# Patient Record
Sex: Female | Born: 1990
Health system: Southern US, Community
[De-identification: ages and names within clinical notes are randomized; demographics above are authoritative.]

## PROBLEM LIST (undated history)

## (undated) DIAGNOSIS — Z3490 Encounter for supervision of normal pregnancy, unspecified, unspecified trimester: Secondary | ICD-10-CM

## (undated) DIAGNOSIS — F259 Schizoaffective disorder, unspecified: Secondary | ICD-10-CM

## (undated) DIAGNOSIS — F53 Postpartum depression: Secondary | ICD-10-CM

## (undated) DIAGNOSIS — F4310 Post-traumatic stress disorder, unspecified: Secondary | ICD-10-CM

## (undated) DIAGNOSIS — F250 Schizoaffective disorder, bipolar type: Secondary | ICD-10-CM

## (undated) DIAGNOSIS — O99345 Other mental disorders complicating the puerperium: Secondary | ICD-10-CM

## (undated) HISTORY — PX: HIP SURGERY: SHX245

---

## 2013-12-20 ENCOUNTER — Emergency Department (HOSPITAL_COMMUNITY)
Admission: EM | Admit: 2013-12-20 | Discharge: 2013-12-21 | Disposition: A | Discharge status: Other Acute Inpatient Hospital | Payer: 59 | Attending: Emergency Medicine | Admitting: Emergency Medicine

## 2013-12-20 ENCOUNTER — Encounter (HOSPITAL_COMMUNITY): Payer: Self-pay

## 2013-12-20 DIAGNOSIS — R000 Tachycardia, unspecified: Secondary | ICD-10-CM | POA: Diagnosis not present

## 2013-12-20 DIAGNOSIS — Z3202 Encounter for pregnancy test, result negative: Secondary | ICD-10-CM | POA: Diagnosis not present

## 2013-12-20 DIAGNOSIS — Z720 Tobacco use: Secondary | ICD-10-CM | POA: Diagnosis not present

## 2013-12-20 DIAGNOSIS — F29 Unspecified psychosis not due to a substance or known physiological condition: Secondary | ICD-10-CM | POA: Diagnosis not present

## 2013-12-20 DIAGNOSIS — F259 Schizoaffective disorder, unspecified: Secondary | ICD-10-CM | POA: Diagnosis not present

## 2013-12-20 DIAGNOSIS — R443 Hallucinations, unspecified: Secondary | ICD-10-CM | POA: Diagnosis present

## 2013-12-20 HISTORY — DX: Schizoaffective disorder, bipolar type: F25.0

## 2013-12-20 HISTORY — DX: Other mental disorders complicating the puerperium: O99.345

## 2013-12-20 HISTORY — DX: Postpartum depression: F53.0

## 2013-12-20 HISTORY — DX: Post-traumatic stress disorder, unspecified: F43.10

## 2013-12-20 HISTORY — DX: Schizoaffective disorder, unspecified: F25.9

## 2013-12-20 LAB — CBC WITH DIFFERENTIAL/PLATELET
Basophils Absolute: 0 10*3/uL (ref 0.0–0.1)
Basophils Relative: 0 % (ref 0–1)
Eosinophils Absolute: 0 10*3/uL (ref 0.0–0.7)
Eosinophils Relative: 0 % (ref 0–5)
HCT: 34.8 % — ABNORMAL LOW (ref 36.0–46.0)
Hemoglobin: 11.6 g/dL — ABNORMAL LOW (ref 12.0–15.0)
Lymphocytes Relative: 17 % (ref 12–46)
Lymphs Abs: 1.7 10*3/uL (ref 0.7–4.0)
MCH: 28.1 pg (ref 26.0–34.0)
MCHC: 33.3 g/dL (ref 30.0–36.0)
MCV: 84.3 fL (ref 78.0–100.0)
Monocytes Absolute: 1 10*3/uL (ref 0.1–1.0)
Monocytes Relative: 10 % (ref 3–12)
Neutro Abs: 7.2 10*3/uL (ref 1.7–7.7)
Neutrophils Relative %: 73 % (ref 43–77)
Platelets: 162 10*3/uL (ref 150–400)
RBC: 4.13 MIL/uL (ref 3.87–5.11)
RDW: 15.2 % (ref 11.5–15.5)
WBC: 9.9 10*3/uL (ref 4.0–10.5)

## 2013-12-20 LAB — COMPREHENSIVE METABOLIC PANEL
ALT: 15 U/L (ref 0–35)
AST: 22 U/L (ref 0–37)
Albumin: 4.4 g/dL (ref 3.5–5.2)
Alkaline Phosphatase: 51 U/L (ref 39–117)
Anion gap: 16 — ABNORMAL HIGH (ref 5–15)
BUN: 11 mg/dL (ref 6–23)
CO2: 24 mEq/L (ref 19–32)
Calcium: 10 mg/dL (ref 8.4–10.5)
Chloride: 104 mEq/L (ref 96–112)
Creatinine, Ser: 0.6 mg/dL (ref 0.50–1.10)
GFR calc Af Amer: >90 mL/min (ref >90)
GFR calc non Af Amer: >90 mL/min (ref >90)
Glucose, Bld: 109 mg/dL — ABNORMAL HIGH (ref 70–99)
Potassium: 3.8 mEq/L (ref 3.7–5.3)
Sodium: 144 mEq/L (ref 137–147)
Total Bilirubin: 0.2 mg/dL — ABNORMAL LOW (ref 0.3–1.2)
Total Protein: 7.9 g/dL (ref 6.0–8.3)

## 2013-12-20 LAB — RAPID URINE DRUG SCREEN, HOSP PERFORMED
Amphetamines: NOT DETECTED
Barbiturates: NOT DETECTED
Benzodiazepines: NOT DETECTED
Cocaine: NOT DETECTED
Opiates: NOT DETECTED
Tetrahydrocannabinol: NOT DETECTED

## 2013-12-20 LAB — POC URINE PREG, ED: Preg Test, Ur: NEGATIVE

## 2013-12-20 LAB — SALICYLATE LEVEL: Salicylate Lvl: <2 mg/dL — ABNORMAL LOW (ref 2.8–20.0)

## 2013-12-20 LAB — ACETAMINOPHEN LEVEL: Acetaminophen (Tylenol), Serum: <15 ug/mL (ref 10–30)

## 2013-12-20 MED ORDER — IBUPROFEN 200 MG PO TABS: 600.0000 mg | ORAL_TABLET | Freq: Three times a day (TID) | ORAL | Status: DC | PRN

## 2013-12-20 MED ORDER — ZIPRASIDONE MESYLATE 20 MG IM SOLR
10.0000 mg | Freq: Once | INTRAMUSCULAR | Status: AC
  Administered 2013-12-20: 10 mg via INTRAMUSCULAR
  Filled 2013-12-20: qty 20

## 2013-12-20 MED ORDER — LORAZEPAM 2 MG/ML IJ SOLN
2.0000 mg | Freq: Once | INTRAMUSCULAR | Status: AC
  Administered 2013-12-20: 2 mg via INTRAMUSCULAR
  Filled 2013-12-20: qty 1

## 2013-12-20 MED ORDER — ONDANSETRON HCL 4 MG PO TABS: 4.0000 mg | ORAL_TABLET | Freq: Three times a day (TID) | ORAL | Status: DC | PRN

## 2013-12-20 MED ORDER — ZOLPIDEM TARTRATE 5 MG PO TABS
5.0000 mg | ORAL_TABLET | Freq: Every evening | ORAL | Status: DC | PRN
  Filled 2013-12-20: qty 1

## 2013-12-20 MED ORDER — ACETAMINOPHEN 325 MG PO TABS: 650.0000 mg | ORAL_TABLET | ORAL | Status: DC | PRN

## 2013-12-20 MED ORDER — NICOTINE 21 MG/24HR TD PT24
21.0000 mg | MEDICATED_PATCH | Freq: Every day | TRANSDERMAL | Status: DC
  Administered 2013-12-20 – 2013-12-21 (×2): 21 mg via TRANSDERMAL
  Filled 2013-12-20 (×2): qty 1

## 2013-12-20 MED ORDER — LORAZEPAM 1 MG PO TABS
1.0000 mg | ORAL_TABLET | Freq: Three times a day (TID) | ORAL | Status: DC | PRN
  Administered 2013-12-20 – 2013-12-21 (×2): 1 mg via ORAL
  Filled 2013-12-20 (×3): qty 1

## 2013-12-20 MED ORDER — ALUM & MAG HYDROXIDE-SIMETH 200-200-20 MG/5ML PO SUSP: 30.0000 mL | ORAL | Status: DC | PRN
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
## 2013-12-20 NOTE — ED Notes (Signed)
Per sister, pt has history of schizoeffective disorder.  Pt started with loss of biological parents death in 2010.  Pt was doing well on medications, yet recently had child and went into postpartum.  Pt has relapsed.  [facility]  Released 2 days ago.  Word salad and responding to internal stimuli.  Son is with her sister.  No evidence of violence.  Unable to get questions answered. With any question to patient, she becomes jumbled in speech.

## 2013-12-20 NOTE — ED Notes (Signed)
Patient intrusive and repeating the phrase "I want to get dressed, I need to leave now". Pt following RN into nursing station and attempting to force her way into doorway. Pt unable to be redirected and security notified for medication administration.  Pt yelling loudly on the unit, tearful "Excuse me, excuse me, excuse me!!!!!" Pt scaring other patients on the unit. RN attempted multiple times to calm patient down unsuccessfully.

## 2013-12-20 NOTE — ED Provider Notes (Signed)
CSN: [id number]     Arrival date & time 12/20/13  1632 History   First MD Initiated Contact with Patient 12/20/13 1721     Chief Complaint  Patient presents with  . Hallucinations     (Consider location/radiation/quality/duration/timing/severity/associated sxs/prior Treatment) HPI Comments: Patient is a 23 year old female with history of schizoaffective schizophrenia, PTSD, postpartum depression who presents the emergency department today for evaluation of abnormal behavior. [facility]. Her sister reports that she has acutely worsened since 10 PM last night. She has tangential thoughts. She states "I can't wear the weather period". Her sister has custody of her 843 month old child. Her sister reports she has not been compliant with medications.   The history is provided by the patient and a relative. No language interpreter was used.    Past Medical History  Diagnosis Date  . Schizo affective schizophrenia   . PTSD (post-traumatic stress disorder)   . Postpartum depression    Past Surgical History  Procedure Laterality Date  . Cesarean section    . Hip surgery     History reviewed. No pertinent family history. History  Substance Use Topics  . Smoking status: Current Every Day Smoker  . Smokeless tobacco: Not on file  . Alcohol Use: No   OB History    No data available     Review of Systems  Unable to perform ROS: Psychiatric disorder      Allergies  Trazodone and nefazodone  Home Medications   Prior to Admission medications   Not on File   BP 143/85 mmHg  Pulse 105  Temp(Src) 98 F (36.7 C) (Oral)  Resp 18  SpO2 100%  LMP 12/06/2013 Physical Exam  Constitutional: She appears well-developed and well-nourished. No distress.  HENT:  Head: Normocephalic and atraumatic.  Right Ear: External ear normal.  Left Ear: External ear normal.  Nose: Nose normal.  Mouth/Throat: Oropharynx is clear and moist.  Eyes:  Conjunctivae are normal.  Neck: Normal range of motion.  Cardiovascular: Regular rhythm and normal heart sounds.  Tachycardia present.   Pulmonary/Chest: Effort normal and breath sounds normal. No stridor. No respiratory distress. She has no wheezes. She has no rales.  Abdominal: Soft. She exhibits no distension.  Musculoskeletal: Normal range of motion.  Neurological: She is alert. She has normal strength.  Oriented to person  Skin: Skin is warm and dry. She is not diaphoretic. No erythema.  Psychiatric: Her speech is tangential. Thought content is delusional. Cognition and memory are impaired. She expresses no homicidal and no suicidal ideation.  Nursing note and vitals reviewed.   ED Course  Procedures (including critical care time) Labs Review Labs Reviewed  CBC WITH DIFFERENTIAL - Abnormal; Notable for the following:    Hemoglobin 11.6 (*)    HCT 34.8 (*)    All other components within normal limits  COMPREHENSIVE METABOLIC PANEL - Abnormal; Notable for the following:    Glucose, Bld 109 (*)    Total Bilirubin 0.2 (*)    Anion gap 16 (*)    All other components within normal limits  SALICYLATE LEVEL - Abnormal; Notable for the following:    Salicylate Lvl <2.0 (*)    All other components within normal limits  ACETAMINOPHEN LEVEL  URINE RAPID DRUG SCREEN (HOSP PERFORMED)  POC URINE PREG, ED    Imaging Review No results found.   EKG Interpretation None      MDM   Final diagnoses:  Schizoaffective disorder, unspecified type  Psychosis, unspecified psychosis type    Patient presents to ED after being brought in at the encouragement of her sister. Tangential thoughts and word salad. She does not have capacity to make her own decisions and needs IVC. IVC paperwork completed. Patient accepted to inpatient psychiatry. Vital signs stable. Patient is medically cleared.     Mora BellmanHannah S Joshu Furukawa, PA-C 12/20/13 2348  Raeford RazorStephen Kohut, MD 12/25/13 2114

## 2013-12-20 NOTE — BH Assessment (Signed)
Received call from [facility] [employer] saying Pt has been declined.  [name], LPC, NCC Triage Specialist [phone]  

## 2013-12-20 NOTE — BHH Counselor (Signed)
Pt's sister Zacarias PontesJasmine Broce, [phone]. Katrinka BlazingSmith is guardian of pt's infant son Seleta Rhymesrinton and Katrinka BlazingSmith is caring for the infant.  Pt signed consent to release info.  Evette Cristalaroline Paige Kamirah Shugrue, [place]WA Assessment Counselor

## 2013-12-20 NOTE — ED Notes (Signed)
Patient to room 43. She is confused and disoriented.  Asking to leave and for a smoking patch.  Declines ativan po. Constant redirection given.

## 2013-12-20 NOTE — BH Assessment (Signed)
Berneice Heinrichina Tate, [facility], confirms adult unit is at capacity. Kathryne SharperSyed Arfeen, MD recommends inpatient psychiatric treatment. Contacted the following facilities for placement:  BED AVAILABLE, FAXED CLINICAL INFORMATION: [facility]  AT CAPACITY[facility]  NO RESPONSE: [facility]   [address]Naimah Yingst Ellis Patsy BaltimoreWarrick Jr, [place]LPC, [place]CC Triage Specialist [phone]
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
## 2013-12-21 ENCOUNTER — Inpatient Hospital Stay (HOSPITAL_COMMUNITY)
Admission: AD | Admit: 2013-12-21 | Discharge: 2013-12-27 | DRG: 885 | Disposition: A | Discharge status: Home / Self Care | Payer: 59 | Attending: Psychiatry | Admitting: Psychiatry

## 2013-12-21 ENCOUNTER — Encounter (HOSPITAL_COMMUNITY): Payer: Self-pay | Admitting: *Deleted

## 2013-12-21 DIAGNOSIS — F250 Schizoaffective disorder, bipolar type: Secondary | ICD-10-CM | POA: Insufficient documentation

## 2013-12-21 DIAGNOSIS — Z9119 Patient's noncompliance with other medical treatment and regimen: Secondary | ICD-10-CM | POA: Diagnosis present

## 2013-12-21 DIAGNOSIS — F4310 Post-traumatic stress disorder, unspecified: Secondary | ICD-10-CM

## 2013-12-21 DIAGNOSIS — F310 Bipolar disorder, current episode hypomanic: Secondary | ICD-10-CM | POA: Diagnosis present

## 2013-12-21 DIAGNOSIS — F53 Postpartum depression: Secondary | ICD-10-CM | POA: Insufficient documentation

## 2013-12-21 DIAGNOSIS — F17210 Nicotine dependence, cigarettes, uncomplicated: Secondary | ICD-10-CM | POA: Diagnosis present

## 2013-12-21 DIAGNOSIS — F3130 Bipolar disorder, current episode depressed, mild or moderate severity, unspecified: Secondary | ICD-10-CM

## 2013-12-21 DIAGNOSIS — F259 Schizoaffective disorder, unspecified: Secondary | ICD-10-CM | POA: Insufficient documentation

## 2013-12-21 DIAGNOSIS — O99345 Other mental disorders complicating the puerperium: Secondary | ICD-10-CM

## 2013-12-21 DIAGNOSIS — F251 Schizoaffective disorder, depressive type: Secondary | ICD-10-CM | POA: Diagnosis present

## 2013-12-21 MED ORDER — BENZTROPINE MESYLATE 1 MG PO TABS
1.0000 mg | ORAL_TABLET | Freq: Two times a day (BID) | ORAL | Status: DC
  Administered 2013-12-22 – 2013-12-25 (×7): 1 mg via ORAL
  Filled 2013-12-21 (×12): qty 1

## 2013-12-21 MED ORDER — DIVALPROEX SODIUM 500 MG PO DR TAB
500.0000 mg | DELAYED_RELEASE_TABLET | Freq: Two times a day (BID) | ORAL | Status: DC
  Administered 2013-12-22 – 2013-12-26 (×9): 500 mg via ORAL
  Filled 2013-12-21 (×12): qty 1

## 2013-12-21 MED ORDER — ACETAMINOPHEN 325 MG PO TABS
650.0000 mg | ORAL_TABLET | Freq: Four times a day (QID) | ORAL | Status: DC | PRN
  Administered 2013-12-23 – 2013-12-24 (×2): 650 mg via ORAL
  Filled 2013-12-21 (×2): qty 2

## 2013-12-21 MED ORDER — ZOLPIDEM TARTRATE 5 MG PO TABS: 5.0000 mg | ORAL_TABLET | Freq: Every evening | ORAL | Status: DC | PRN

## 2013-12-21 MED ORDER — ONDANSETRON HCL 4 MG PO TABS: 4.0000 mg | ORAL_TABLET | Freq: Three times a day (TID) | ORAL | Status: DC | PRN

## 2013-12-21 MED ORDER — DIVALPROEX SODIUM 500 MG PO DR TAB
500.0000 mg | DELAYED_RELEASE_TABLET | Freq: Two times a day (BID) | ORAL | Status: DC
  Administered 2013-12-21: 500 mg via ORAL
  Filled 2013-12-21: qty 1

## 2013-12-21 MED ORDER — LORAZEPAM 1 MG PO TABS
1.0000 mg | ORAL_TABLET | Freq: Three times a day (TID) | ORAL | Status: DC | PRN
  Administered 2013-12-22 – 2013-12-23 (×3): 1 mg via ORAL
  Filled 2013-12-21 (×3): qty 1

## 2013-12-21 MED ORDER — NICOTINE 21 MG/24HR TD PT24
21.0000 mg | MEDICATED_PATCH | Freq: Every day | TRANSDERMAL | Status: DC
  Administered 2013-12-22 – 2013-12-27 (×6): 21 mg via TRANSDERMAL
  Filled 2013-12-21 (×12): qty 1

## 2013-12-21 MED ORDER — BENZTROPINE MESYLATE 1 MG PO TABS
1.0000 mg | ORAL_TABLET | Freq: Two times a day (BID) | ORAL | Status: DC
  Administered 2013-12-21: 1 mg via ORAL
  Filled 2013-12-21: qty 1

## 2013-12-21 MED ORDER — MAGNESIUM HYDROXIDE 400 MG/5ML PO SUSP: 30.0000 mL | Freq: Every day | ORAL | Status: DC | PRN

## 2013-12-21 MED ORDER — RISPERIDONE 2 MG PO TABS
2.0000 mg | ORAL_TABLET | Freq: Two times a day (BID) | ORAL | Status: DC
  Administered 2013-12-22 – 2013-12-24 (×5): 2 mg via ORAL
  Filled 2013-12-21 (×8): qty 1

## 2013-12-21 MED ORDER — ALUM & MAG HYDROXIDE-SIMETH 200-200-20 MG/5ML PO SUSP
30.0000 mL | ORAL | Status: DC | PRN
Start: 2013-12-21 — End: 2013-12-21

## 2013-12-21 MED ORDER — RISPERIDONE 2 MG PO TABS
2.0000 mg | ORAL_TABLET | Freq: Two times a day (BID) | ORAL | Status: DC
  Administered 2013-12-21: 2 mg via ORAL
  Filled 2013-12-21: qty 1

## 2013-12-21 MED ORDER — ALUM & MAG HYDROXIDE-SIMETH 200-200-20 MG/5ML PO SUSP: 30.0000 mL | ORAL | Status: DC | PRN

## 2013-12-21 MED ORDER — IBUPROFEN 600 MG PO TABS: 600.0000 mg | ORAL_TABLET | Freq: Three times a day (TID) | ORAL | Status: DC | PRN

## 2013-12-21 NOTE — ED Notes (Signed)
rn called 911 at 1748 and spoke to sara for transport of this pt. [facility] was called [facility]. sgt moose called at 1753 and stated they will p/u pt soon. Pt took all scheduled medications with a ativan prn for her anxiety at 1707;

## 2013-12-21 NOTE — ED Notes (Signed)
Nursing shift note: pt has come out of her room 2 to 3 times today.she has been cooperative, yet anxious and ask the same question several times. She seems to be responding to auditory hallucinations. He sister jasmine is part of her support network. She is an involuntary admission. She has no complained or voiced any needs.
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
## 2013-12-21 NOTE — Tx Team (Signed)
Initial Interdisciplinary Treatment Plan   PATIENT STRESSORS: Financial difficulties Medication change or noncompliance   PATIENT STRENGTHS: Communication skills   PROBLEM LIST: Problem List/Patient Goals Date to be addressed Date deferred Reason deferred Estimated date of resolution  psychosis 12/21/13   dc                                                   DISCHARGE CRITERIA:  Ability to meet basic life and health needs Improved stabilization in mood, thinking, and/or behavior Motivation to continue treatment in a less acute level of care Reduction of life-threatening or endangering symptoms to within safe limits Safe-care adequate arrangements made  PRELIMINARY DISCHARGE PLAN: Outpatient therapy Return to previous living arrangement  PATIENT/FAMIILY INVOLVEMENT: This treatment plan has been presented to and reviewed with the patient, Brandy Sweeney, and/or family member, .  The patient and family have been given the opportunity to ask questions and make suggestions.  Malva LimesStrader, Koya Hunger 12/21/2013, 7:18 PM

## 2013-12-21 NOTE — Progress Notes (Signed)
Nursing note; [facility] and [facility].

## 2013-12-21 NOTE — Progress Notes (Signed)
Patient ID: Brandy Sweeney, female   DOB: 06/12/1990, 23 y.o.   MRN: [id number] [facility]. Pt is poor historian. Pt presents with disorganized thoughts, affect blunted. She states '' i came to get a shot to get my meds adjusted because I've been irritable and hearing things. '' She denies any auditory hallucinations at present, denies any SI/HI. Pt clearly preoccupied and with noted psychomotor retardation. Pt having difficulty following simply commands at times. Skin was searched no issues noted and no contraband found. She was oriented to the unit. Low fall risk. Meal provided. VS WNL.

## 2013-12-22 DIAGNOSIS — O99345 Other mental disorders complicating the puerperium: Secondary | ICD-10-CM

## 2013-12-22 DIAGNOSIS — F259 Schizoaffective disorder, unspecified: Secondary | ICD-10-CM

## 2013-12-22 DIAGNOSIS — F250 Schizoaffective disorder, bipolar type: Secondary | ICD-10-CM | POA: Insufficient documentation

## 2013-12-22 DIAGNOSIS — F53 Postpartum depression: Secondary | ICD-10-CM | POA: Insufficient documentation

## 2013-12-22 DIAGNOSIS — F3160 Bipolar disorder, current episode mixed, unspecified: Secondary | ICD-10-CM

## 2013-12-22 NOTE — [facility] Group Notes (Signed)
[facility] LCSW Group Therapy  12/22/2013 11:00 AM   Type of Therapy:  Group Therapy  Participation Level:  Did Not Attend  Easter Kennebrew Horton, LCSW 12/22/2013 12:05 PM   

## 2013-12-22 NOTE — Progress Notes (Signed)
D: Pt has depressed affect and mood.  Pt reports her goal today was "going home and being stable on my medicine."  Pt reports she had a good visit with her sister, brother, and son.  Pt denies SI/HI, denies hallucinations.  Pt interacts appropriately with peers and staff.  Pt did not attend evening group.   A: Met with pt 1:1 and provided support and encouragement.  Safety maintained. R: Pt reported she will notify writer of needs and concerns.  Pt verbally contracts for safety and is in no apparent distress.  Will continue to monitor and assess for safety.

## 2013-12-22 NOTE — [facility] Suicide Risk Assessment (Signed)
   Nursing information obtained from:  Patient, Review of record Demographic factors:  Adolescent or young adult, Low socioeconomic status Current Mental Status:  NA Loss Factors:  Decline in physical health Historical Factors:  NA Risk Reduction Factors:  Responsible for children under 23 years of age Total Time spent with patient: 1 hour  CLINICAL FACTORS:   Postpartum Depression More than one psychiatric diagnosis Currently Psychotic Previous Psychiatric Diagnoses and Treatments  Psychiatric Specialty Exam: Physical Exam  ROS  Blood pressure 96/67, pulse 96, temperature 97.7 F (36.5 C), temperature source Oral, resp. rate 20, height 5\' 5"  (1.651 m), weight 107.502 kg (237 lb), last menstrual period 12/06/2013, SpO2 100 %.Body mass index is 39.44 kg/(m^2).  General Appearance: Guarded  Eye Contact::  Poor  Speech:  Pressured  Volume:  Normal  Mood:  Depressed and Irritable  Affect:  Constricted, Depressed and Labile  Thought Process:  Disorganized, Irrelevant and Loose  Orientation:  Full (Time, Place, and Person)  Thought Content:  Delusions, Hallucinations: Auditory Visual and Paranoid Ideation  Suicidal Thoughts:  No  Homicidal Thoughts:  No  Memory:  Immediate;   Poor Recent;   Fair Remote;   Poor  Judgement:  Impaired  Insight:  Lacking  Psychomotor Activity:  Decreased  Concentration:  Fair  Recall:  Poor  Fund of Knowledge:Fair  Language: Fair  Akathisia:  No  Handed:  Right  AIMS (if indicated):     Assets:  Housing  Sleep:  Number of Hours: 6.75   Musculoskeletal: Strength & Muscle Tone: within normal limits Gait & Station: normal Patient leans: N/A  COGNITIVE FEATURES THAT CONTRIBUTE TO RISK:  Closed-mindedness Loss of executive function Polarized thinking Thought constriction (tunnel vision)    SUICIDE RISK:   Mild:  Suicidal ideation of limited frequency, intensity, duration, and specificity.  There are no identifiable plans, no associated  intent, mild dysphoria and related symptoms, good self-control (both objective and subjective assessment), few other risk factors, and identifiable protective factors, including available and accessible social support.  PLAN OF CARE:  I certify that inpatient services furnished can reasonably be expected to improve the patient's condition.  Abijah Roussel T. 12/22/2013, 11:08 AM

## 2013-12-22 NOTE — Plan of Care (Signed)
Problem: Ineffective individual coping Goal: STG: Patient will remain free from self harm Outcome: Progressing Patient has remained free from harm this shift.

## 2013-12-22 NOTE — [facility] Group Notes (Signed)
[facility] Group Notes:  (Nursing/MHT/Case Management/Adjunct)  Date:  12/22/2013  Time:  9:13 AM  Type of Therapy: Psychoeducational Skills  Participation Level: Active  Participation Quality: Appropriate  Affect: Appropriate  Cognitive: Appropriate  Insight: Appropriate  Engagement in Group: Engaged  Modes of Intervention: Problem-solving  Summary of Progress/Problems: Pt attended patient self inventory group.  Brandy BalintForrest, Brandy Sweeney Brandy Sweeney 12/22/2013, 9:13 AM

## 2013-12-22 NOTE — Progress Notes (Signed)
Adult Psychoeducational Group Note  Date:  12/22/2013 Time:  8:47 PM  Group Topic/Focus:  Wrap-Up Group:   The focus of this group is to help patients review their daily goal of treatment and discuss progress on daily workbooks.  Participation Level:  Did Not Attend   Brandy Sweeney, Brandy Sweeney 12/22/2013, 8:47 PM
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
## 2013-12-22 NOTE — BHH Group Notes (Signed)
BHH Group Notes:  (Nursing/MHT/Case Management/Adjunct)  Date:  12/22/2013  Time:  10:09 AM  Type of Therapy: Psychoeducational Skills  Participation Level: Active  Participation Quality: Appropriate  Affect: Appropriate  Cognitive: Appropriate  Insight: Appropriate  Engagement in Group: Engaged  Modes of Intervention: Problem-solving  Summary of Progress/Problems: Pt attended healthy coping skills group.  Brandy Sweeney 12/22/2013, 10:09 AM 

## 2013-12-22 NOTE — Progress Notes (Signed)
Report received from L. Strader RN, patient was admitted moments before shift change. Writer entered patients room and observed her lying in bed asleep but was aroused when Clinical research associatewriter called her name. She had no request for medication and was offered a snack but declined. She denies si/hi/a/v hallucination, writer was unable to assess her d/t her drowsiness. Safety maintained on unit with 15 min checks.

## 2013-12-22 NOTE — Progress Notes (Signed)
Patient ID: Brandy Sweeney, female   DOB: 05/20/1990, 23 y.o.   MRN: [id number] D. Pt presents with depressed mood , affect congruent. Brandy Sweeney has been pleasant and cooperative throughout shift. She reports her auditory hallucinations have stopped and she states '' I just want to get on the injection so i can get back home to my son. Maybe I can even get my own place. '' She denies feeling depressed, denies any SI/HI. Brandy Sweeney shows great improvement this morning , interacting on the unit, smiling with staff and peers. She completed self inventory and rates no concerns stating ''when can I go home. '' She denies depression rating at 1/10 on depression scale, 1 being least 10 being worst. A. Medications given as ordered. Support and encouragement provided. Discussed above with Dr. Lolly MustacheArfeen and May NP. R. Patient is safe. In no acute distress at this time. Will continue to monitor q 15 minutes for safety.

## 2013-12-22 NOTE — BHH Counselor (Signed)
Adult Comprehensive Assessment  Patient ID: Brandy Sweeney, female   DOB: 06/03/1990, 23 y.o.   MRN: [id number]  Information Source: Information source: Patient  Current Stressors:  Educational / Learning stressors: Pt denies Employment / Job issues: Pt denies Family Relationships: Pt denies Surveyor, quantityinancial / Lack of resources (include bankruptcy): "I want to work and go to school but I just had a baby 3 months ago" Currently unemployed Housing / Lack of housing: Currently living with mother and trying to have her own place but sister thinks she needs to be in a group home due to not being able to fully provide for herself and her son. Pt reports she does not want to go to a group home. Physical health (include injuries & life threatening diseases): Pt reports having had hip surgery 4 years ago.  Social relationships: Pt denies Substance abuse: Pt denies Bereavement / Loss: Biological parents passed away about 5 years go.   Living/Environment/Situation:  Living Arrangements: Parent, Children (Living with foster mother and 563 month old child) Living conditions (as described by patient or guardian): "Great environment" How long has patient lived in current situation?: 6th grade What is atmosphere in current home: Comfortable  Family History:  Marital status: Single Does patient have children?: Yes How many children?: 1 How is patient's relationship with their children?: "Good, he is little but I would do anything for him"  Childhood History:  By whom was/is the patient raised?: Mother, Sibling (Raised by adoptive mom, foster mom and older sister; however, was in and out of group homes since age 23.) Description of patient's relationship with caregiver when they were a child: "It was just good" Patient's description of current relationship with people who raised him/her: Pt reports "good" relationship with all three. "My sister does everything she can to provide for me even though I am 23"  Does  patient have siblings?: Yes Number of Siblings: 7 Description of patient's current relationship with siblings: "It's good, I am closer to my older brother"  Did patient suffer any verbal/emotional/physical/sexual abuse as a child?: No Did patient suffer from severe childhood neglect?: No Has patient ever been sexually abused/assaulted/raped as an adolescent or adult?: No Was the patient ever a victim of a crime or a disaster?: No Witnessed domestic violence?: No Has patient been effected by domestic violence as an adult?: No  Education:  Highest grade of school patient has completed: Some college Currently a Consulting civil engineerstudent?: No Learning disability?: No  Employment/Work Situation:   Employment situation: Unemployed Patient's job has been impacted by current illness: No What is the longest time patient has a held a job?: 6-9 months 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 Has patient ever been in the Eli Lilly and Companymilitary?: No Has patient ever served in Buyer, retailcombat?: No  Financial Resources:   Surveyor, quantityinancial resources: Actoreceives [employer] (TANF), [employer]icaid, Food stamps Does patient have a Lawyerrepresentative payee or guardian?: Yes Name of representative payee or guardian: Clydia LlanoGale Ray   Alcohol/Substance Abuse:   What has been your use of drugs/alcohol within the last 12 months?: "I don't drink and I only smoke cigarettes" If attempted suicide, did drugs/alcohol play a role in this?: No Alcohol/Substance Abuse Treatment Hx: Denies past history Has alcohol/substance abuse ever caused legal problems?: No  Social Support System:   Patient's Community Support System: Fair Museum/gallery exhibitions officerDescribe Community Support System: "All my siblings really help me and my foster mom" Type of faith/religion: Ephriam KnucklesChristian How does patient's faith help to cope with current  illness?: "Prayer helps"  Leisure/Recreation:   Leisure and Hobbies: Listen to music  Strengths/Needs:   What things does the patient do well?:  Reading, writing, dancing In what areas does patient struggle / problems for patient: Stronger social skills  Discharge Plan:   Does patient have access to transportation?: Yes Will patient be returning to same living situation after discharge?: Yes Currently receiving community mental health services: Yes (From Whom) Diplomatic Services operational officer([facility] [place] [facility] If no, would patient like referral for services when discharged?: No Does patient have financial barriers related to discharge medications?: No  Summary/Recommendations:   Patient is a 23 year old, African American, single, unemployed, female admitted with diagnosis of Schizoaffective disorder. Pt reports she is currently living with foster mom and is trying to get her place; however, sister wants her to go to group home. Pt was limited in her sharing. Patient would benefit from crisis stabilization, medication evaluation, therapy groups for processing thoughts/feelings/experiences, psycho ed groups for increasing coping skills, and aftercare planning. Discharge Process and Patient Expectations information sheet signed by patient, witnessed by writer and inserted in patient's shadow chart.   Calton DachWendy F. Faheem Ziemann, MSW, [facility] AugustaCSWA 12/22/2013 4:23 PM

## 2013-12-23 DIAGNOSIS — F251 Schizoaffective disorder, depressive type: Secondary | ICD-10-CM

## 2013-12-23 NOTE — Progress Notes (Signed)
Pt presents with flat affect and depressed mood. Pt forwarded little information during shift assessment. Pt appears to be minimizing her symptoms. Pt guarded and withdrawn this morning. Pt denying SI/HI/AVH/Dep. Pt shows no insight. Pt requesting to be discharge today. Pt compliant with taking meds this morning.   Medications administered as ordered per MD. Verbal support given. Pt encouraged to attend groups. 15 minute checks performed for safety.  Pt safety maintained at this time.

## 2013-12-23 NOTE — BHH Group Notes (Signed)
[facility] Dis[facility] Note   12/23/2013 10:13 AM  Participation Quality:  Appropriate   Mood/Affect:  Appropriate  Depression Rating:  0  Anxiety Rating:  2  Thoughts of Suicide:  No Will you contract for safety?   NA  Current AVH:  No  Plan for Discharge/Comments:  "I was just at the hospital for two weeks. I only came here because I couldn't get my injection due to the holiday." Pt reports [facility] [place] [facility]. Pt reports that she is in the process of moving into a new apt but will stay with her brother until she is able to move. [facility] and plans to continue services with them. Pt is anxious to d/c and denies SI/HI/AVH.   Transportation Means: sister   Supports: siblings   Counselling psychologistmart, OncologistHeather LCSWA
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
## 2013-12-23 NOTE — Plan of Care (Signed)
Problem: Ineffective individual coping Goal: STG: Patient will remain free from self harm Outcome: Progressing Pt has not harmed herself this shift.       

## 2013-12-23 NOTE — BHH Group Notes (Signed)
B[facility] Group Notes:  (Counselor/Nursing/MHT/Case Management/Adjunct)  12/23/2013 1:15PM  Type of Therapy:  Group Therapy  Participation Level:  Active  Participation Quality:  Appropriate  Affect:  Flat  Cognitive:  Oriented  Insight:  Improving  Engagement in Group:  Limited  Engagement in Therapy:  Limited  Modes of Intervention:  Discussion, Exploration and Socialization  Summary of Progress/Problems: The topic for group was balance in life.  Pt participated in the discussion about when their life was in balance and out of balance and how this feels.  Pt discussed ways to get back in balance and short term goals they can work on to get where they want to be. "When I get angry, I tend to isolate myself because I do not want to have problems with others.  My sister and I get into arguments because she thinks she knows what's best for me."  Sat quietly throughout.  At the end, identified reading and listening to music as things that take her to a happy place.   Daryel Geraldorth, Shakema Surita B 12/23/2013 1:34 PM

## 2013-12-23 NOTE — Progress Notes (Signed)
D: Pt denies SI/HI/AVH. Pt is pleasant and cooperative. Pt decided to go to group after convincing from Clinical research associatewriter.   A: Pt was offered support and encouragement. Pt was given scheduled medications. Pt was encourage to attend groups. Q 15 minute checks were done for safety.   R:Pt attends groups and interacts well with peers and staff. Pt is taking medication. Pt has no complaints at this time.Pt receptive to treatment and safety maintained on unit.

## 2013-12-23 NOTE — Progress Notes (Signed)
[facility] Progress Note  12/23/2013 2:32 PM Brandy Sweeney  MRN:  [id number] Subjective:  Patient states,'I am feeling better,I want to go to my mother's place." Objective: Patient seen and chart reviewed. Patient today appears to be less anxious ,reports she has improved sleep on her current medications. Patient reports that she has a newborn and that her sx worsened secondary to all the stressors of having the newborn and having to take care of the baby ,as well as not getting enough sleep and also having a lot of guests for thanksgiving. Patient reports that her sister send her to her brother's place ,however she still could not sleep and started doing a lot of activities.  Patient has a hx of schizoaffective disorder, recently was [facility] ([facility] -25). Patient to get her Risperdal consta once every month .Per review of EHR -nxt due on 12/26/2013.  Patient today appears to be improving. Denies SI/HI/AH/VH.Patient continues to have pressured speech as well as appears to be anxious ,however there is an improvement since admission. Patient wants to stay on Risperdal and Risperdal Consta.   Per collateral obtained from sister Leavy CellaJasmine - patient's baby is with her now and she is trying to get custody. However the baby is a trigger for the patient and the patient will not do well with the baby in the same house. Hence pt will be better off if referred for a long term facility. Patient could go to god mother's place but this needs to be discussed with God mother - whose number is Clydia LlanoGale Ray [phone]- [phone].Case manager - Ms.Kirk - [phone]. Pt also has [facility].   Diagnosis:   DSM5: Primary Psychiatric Diagnosis: Schizoaffective disorder,depressed type,multiple episodes ,currently in acute episode     Non Psychiatric Diagnosis: See PMH     Total Time spent with patient: 45 minutes   ADL's:  Intact  Sleep: Fair  Appetite:   Fair    Psychiatric Specialty Exam: Physical Exam  ROS  Blood pressure 126/84, pulse 68, temperature 98.7 F (37.1 C), temperature source Oral, resp. rate 18, height 5\' 5"  (1.651 m), weight 107.502 kg (237 lb), last menstrual period 12/06/2013, SpO2 100 %.Body mass index is 39.44 kg/(m^2).  General Appearance: Casual  Eye Contact::  Fair  Speech:  Pressured  Volume:  Normal  Mood:  Anxious and Depressed  Affect:  Flat  Thought Process:  Linear  Orientation:  Full (Time, Place, and Person)  Thought Content:  WDL  Suicidal Thoughts:  No  Homicidal Thoughts:  No  Memory:  Immediate;   Fair Recent;   Fair Remote;   Fair  Judgement:  Fair  Insight:  Shallow  Psychomotor Activity:  Increased  Concentration:  Fair  Recall:  Poor  Fund of Knowledge:Fair  Language: Fair  Akathisia:  No  Handed:  Right  AIMS (if indicated):     Assets:  Desire for Improvement Housing  Sleep:  Number of Hours: 5.5   Musculoskeletal: Strength & Muscle Tone: within normal limits Gait & Station: normal Patient leans: N/A  Current Medications: Current Facility-Administered Medications  Medication Dose Route Frequency Provider Last Rate Last Dose  . acetaminophen (TYLENOL) tablet 650 mg  650 mg Oral Q6H PRN Nanine MeansJamison Lord, NP      . alum & mag hydroxide-simeth (MAALOX/MYLANTA) 200-200-20 MG/5ML suspension 30 mL  30 mL Oral Q4H PRN Nanine MeansJamison Lord, NP      . benztropine (COGENTIN) tablet 1 mg  1 mg  Oral BID Nanine MeansJamison Lord, NP   1 mg at 12/23/13 0749  . divalproex (DEPAKOTE) DR tablet 500 mg  500 mg Oral BID PC Nanine MeansJamison Lord, NP   500 mg at 12/23/13 0750  . ibuprofen (ADVIL,MOTRIN) tablet 600 mg  600 mg Oral Q8H PRN Nanine MeansJamison Lord, NP      . LORazepam (ATIVAN) tablet 1 mg  1 mg Oral Q8H PRN Nanine MeansJamison Lord, NP   1 mg at 12/23/13 0630  . magnesium hydroxide (MILK OF MAGNESIA) suspension 30 mL  30 mL Oral Daily PRN Nanine MeansJamison Lord, NP      . nicotine (NICODERM CQ - dosed in mg/24 hours) patch 21 mg  21 mg Transdermal  Daily Nanine MeansJamison Lord, NP   21 mg at 12/23/13 0749  . ondansetron (ZOFRAN) tablet 4 mg  4 mg Oral Q8H PRN Nanine MeansJamison Lord, NP      . risperiDONE (RISPERDAL) tablet 2 mg  2 mg Oral BID Nanine MeansJamison Lord, NP   2 mg at 12/23/13 0749  . zolpidem (AMBIEN) tablet 5 mg  5 mg Oral QHS PRN Nanine MeansJamison Lord, NP        Lab Results: No results found for this or any previous visit (from the past 48 hour(s)).  Physical Findings: AIMS: Facial and Oral Movements Muscles of Facial Expression: None, normal Lips and Perioral Area: None, normal Jaw: None, normal Tongue: None, normal,Extremity Movements Upper (arms, wrists, hands, fingers): None, normal Lower (legs, knees, ankles, toes): None, normal, Trunk Movements Neck, shoulders, hips: None, normal, Overall Severity Severity of abnormal movements (highest score from questions above): None, normal Incapacitation due to abnormal movements: None, normal Patient's awareness of abnormal movements (rate only patient's report): No Awareness, Dental Status Current problems with teeth and/or dentures?: No Does patient usually wear dentures?: No  CIWA:  CIWA-Ar Total: 0 COWS:  COWS Total Score: 0  Treatment Plan Summary: Daily contact with patient to assess and evaluate symptoms and progress in treatment Medication management  Plan: Patient presented with depression as well as psychosis. Patient with past hx of depression as well as schizoaffective disorder.Her sx started after the death of her parents in 2010 and 2011. Patient's recent trigger is the birth of her newborn baby who is with her sister at this time. Will continue Risperdal as scheduled. Plan to give a higher dose of Risperdal Consta -next dose due on 12/26/13. Will continue Depakote 500 mg po bid for mood lability. Depakote level on 12/26/13. Continue Ambien 5 mg po qhs prn for sleep. Will get TSH ,lipid panel,HBa1c,EKG since patient presented with mood lability as well as is on an antipsychotic at this  time. Collateral obtained from sister Leavy CellaJasmine -see above documentation. CSW will work on disposition. Patient to participate in therapeutic milieu.     Medical Decision Making Problem Points:  Established problem, stable/improving (1), Review of last therapy session (1) and Review of psycho-social stressors (1) Data Points:  Order Aims Assessment (2) Review or order clinical lab tests (1) Review and summation of old records (2) Review of medication regiment & side effects (2) Review of new medications or change in dosage (2)  I certify that inpatient services furnished can reasonably be expected to improve the patient's condition.   Hurley Blevins md 12/23/2013, 2:32 PM

## 2013-12-23 NOTE — Progress Notes (Signed)
Adult Psychoeducational Group Note  Date:  12/23/2013 Time:  9:58 PM  Group Topic/Focus:  Wrap-Up Group:   The focus of this group is to help patients review their daily goal of treatment and discuss progress on daily workbooks.  Participation Level:  Minimal  Participation Quality:  Drowsy  Affect:  Flat  Cognitive:  Lacking  Insight: Limited  Engagement in Group:  Limited  Modes of Intervention:  Socialization and Support  Additional Comments:  Patient attended and participated in group tonight. She reports having a good day. She went for her meals, attended her groups and took her medication.  Lita MainsFrancis, Nuri Larmer [facility] LPDacosta 12/23/2013, 9:58 PM

## 2013-12-23 NOTE — BHH Suicide Risk Assessment (Signed)
B[facility] INPATIENT:  Family/Significant Other Suicide Prevention Education  Suicide Prevention Education:  Education Completed; Brandy KaufmannGail Sweeney (pt's godmother) [phone] has been identified by the patient as the family member/significant other with whom the patient will be residing, and identified as the person(s) who will aid the patient in the event of a mental health crisis (suicidal ideations/suicide attempt).  With written consent from the patient, the family member/significant other has been provided the following suicide prevention education, prior to the and/or following the discharge of the patient.  The suicide prevention education provided includes the following:  Suicide risk factors  Suicide prevention and interventions  National Suicide Hotline telephone number  [facility] assessment telephone number  [facility] Emergency Assistance 911  [facility] and/or Residential Mobile Crisis Unit telephone number  Request made of family/significant other to:  Remove weapons (e.g., guns, rifles, knives), all items previously/currently identified as safety concern.    Remove drugs/medications (over-the-counter, prescriptions, illicit drugs), all items previously/currently identified as a safety concern.  The family member/significant other verbalizes understanding of the suicide prevention education information provided.  The family member/significant other agrees to remove the items of safety concern listed above.  Brandy SpryGail shared that she does not think pt is at risk for SI or SI attempt. She reported that her main concern is pt's habitual noncompliance with medication.   Brandy Sweeney, Brandy Sweeney LCSWA  12/23/2013, 3:53 PM
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
## 2013-12-24 LAB — HEMOGLOBIN A1C
Hgb A1c MFr Bld: 5.7 % — ABNORMAL HIGH (ref <5.7)
Mean Plasma Glucose: 117 mg/dL — ABNORMAL HIGH (ref <117)

## 2013-12-24 LAB — TSH: TSH: 1.79 u[IU]/mL (ref 0.350–4.500)

## 2013-12-24 LAB — LIPID PANEL
Cholesterol: 178 mg/dL (ref 0–200)
HDL: 30 mg/dL — ABNORMAL LOW (ref >39)
LDL Cholesterol: 117 mg/dL — ABNORMAL HIGH (ref 0–99)
TRIGLYCERIDES: 155 mg/dL — AB (ref <150)
Total CHOL/HDL Ratio: 5.9 RATIO
VLDL: 31 mg/dL (ref 0–40)

## 2013-12-24 MED ORDER — RISPERIDONE 3 MG PO TABS
3.0000 mg | ORAL_TABLET | Freq: Two times a day (BID) | ORAL | Status: DC
  Administered 2013-12-24 – 2013-12-25 (×2): 3 mg via ORAL
  Filled 2013-12-24 (×4): qty 1

## 2013-12-24 NOTE — BHH Group Notes (Signed)
Adult Psychoeducational Group Note  Date:  12/24/2013 Time:  9:06 PM  Group Topic/Focus:  Wrap-Up Group:   The focus of this group is to help patients review their daily goal of treatment and discuss progress on daily workbooks.  Participation Level:  Minimal  Participation Quality:  Appropriate  Affect:  Flat  Cognitive:  Lacking  Insight: Good  Engagement in Group:  Limited  Modes of Intervention:  Discussion  Additional Comments:  Brandy Sweeney stated her day was pretty good.  She expressed that her medications are working and she is having no depression.  She also stated that her discharge plans are to leave by the end of the week.  She went to all of the groups as well.  Brandy Sweeney stated she uses the bible and prayer as motivation to get better.  Brandy Sweeney, Brandy Sweeney 12/24/2013, 9:06 PM

## 2013-12-24 NOTE — Progress Notes (Signed)
D: Patient denies SI/HI and A/V hallucinations; patient reports sleep is good; reports appetite is good; reports energy level is normal ; reports ability to concentrate is poor; rates depression as 1/10; rates hopelessness 0/10; rates anxiety as 1/10;   A: Monitored q 15 minutes; patient encouraged to attend groups; patient educated about medications; patient given medications per physician orders; patient encouraged to express feelings and/or concerns  R: Patient is cooperative and pleasant; patient is appropriate; patient has had several conversations on the phone today and voice was raised and patient was upset but patient continued to stay pleasant with staff and take her medications and patient is tolerating medications; patient has been attending all the groups and engaging
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
## 2013-12-24 NOTE — BHH Group Notes (Signed)
The focus of this group is to educate the patient on the purpose and policies of crisis stabilization and provide a format to answer questions about their admission.  The group details unit policies and expectations of patients while admitted. Patient attended this group. 

## 2013-12-24 NOTE — Progress Notes (Signed)
[facility]  12/24/2013 12:49 PM Brandy Sweeney  MRN:  [id number] Subjective:  Patient states,'I am ok". Objective: Patient seen and chart reviewed. Patient today appears to be improving , focused on DC ,continues to have pressured speech. Reports mood as improving ,however reports being a bit distressed about trying to work on things like "where to go and getting an apt and things like that".  Denies SI/HI/AH/VH. Patient wants to stay on Risperdal and Risperdal Consta.   Per staff ,patient is attending groups ,is more pleasant and is compliant on medications.Patient denies any side effects.  Diagnosis:   DSM5: Primary Psychiatric Diagnosis: Schizoaffective disorder,depressed type,multiple episodes ,currently in acute episode     Non Psychiatric Diagnosis: See PMH     Total Time spent with patient: 45 minutes   ADL's:  Intact  Sleep: Fair  Appetite:  Fair    Psychiatric Specialty Exam: Physical Exam  ROS  Blood pressure 115/77, pulse 106, temperature 99 F (37.2 C), temperature source Oral, resp. rate 20, height 5\' 5"  (1.651 m), weight 107.502 kg (237 lb), last menstrual period 12/06/2013, SpO2 100 %.Body mass index is 39.44 kg/(m^2).  General Appearance: Casual  Eye Contact::  Fair  Speech:  Pressured improving  Volume:  Normal  Mood:  Anxious and Depressedimproving  Affect:  Labile  Thought Process:  Linear  Orientation:  Full (Time, Place, and Person)  Thought Content:  WDL  Suicidal Thoughts:  No  Homicidal Thoughts:  No  Memory:  Immediate;   Fair Recent;   Fair Remote;   Fair  Judgement:  Fair  Insight:  Shallow  Psychomotor Activity:  Increased  Concentration:  Fair  Recall:  Poor  Fund of Knowledge:Fair  Language: Fair  Akathisia:  No  Handed:  Right  AIMS (if indicated):     Assets:  Desire for Improvement Housing  Sleep:  Number of Hours: 6.5   Musculoskeletal: Strength & Muscle Tone: within normal limits Gait & Station:  normal Patient leans: N/A  Current Medications: Current Facility-Administered Medications  Medication Dose Route Frequency Provider Last Rate Last Dose  . acetaminophen (TYLENOL) tablet 650 mg  650 mg Oral Q6H PRN Nanine MeansJamison Lord, NP   650 mg at 12/24/13 0813  . alum & mag hydroxide-simeth (MAALOX/MYLANTA) 200-200-20 MG/5ML suspension 30 mL  30 mL Oral Q4H PRN Nanine MeansJamison Lord, NP      . benztropine (COGENTIN) tablet 1 mg  1 mg Oral BID Nanine MeansJamison Lord, NP   1 mg at 12/24/13 [id number]  . divalproex (DEPAKOTE) DR tablet 500 mg  500 mg Oral BID PC Nanine MeansJamison Lord, NP   500 mg at 12/24/13 0931  . ibuprofen (ADVIL,MOTRIN) tablet 600 mg  600 mg Oral Q8H PRN Nanine MeansJamison Lord, NP      . LORazepam (ATIVAN) tablet 1 mg  1 mg Oral Q8H PRN Nanine MeansJamison Lord, NP   1 mg at 12/23/13 0630  . magnesium hydroxide (MILK OF MAGNESIA) suspension 30 mL  30 mL Oral Daily PRN Nanine MeansJamison Lord, NP      . nicotine (NICODERM CQ - dosed in mg/24 hours) patch 21 mg  21 mg Transdermal Daily Nanine MeansJamison Lord, NP   21 mg at 12/24/13 [id number]  . ondansetron (ZOFRAN) tablet 4 mg  4 mg Oral Q8H PRN Nanine MeansJamison Lord, NP      . risperiDONE (RISPERDAL) tablet 3 mg  3 mg Oral BID Temima Kutsch, MD      . zolpidem (AMBIEN) tablet 5 mg  5 mg Oral QHS PRN  Nanine Means, NP        Lab Results:  Results for orders placed or performed during the hospital encounter of 12/21/13 (from the past 48 hour(s))  Lipid panel     Status: Abnormal   Collection Time: 12/24/13  6:25 AM  Result Value Ref Range   Cholesterol 178 0 - 200 mg/dL   Triglycerides 147 (H) <150 mg/dL   HDL 30 (L) >82 mg/dL   Total CHOL/HDL Ratio 5.9 RATIO   VLDL 31 0 - 40 mg/dL   LDL Cholesterol 956 (H) 0 - 99 mg/dL    Comment:        Total Cholesterol/HDL:CHD Risk Coronary Heart Disease Risk Table                     Men   Women  1/2 Average Risk   3.4   3.3  Average Risk       5.0   4.4  2 X Average Risk   9.6   7.1  3 X Average Risk  23.4   11.0        Use the calculated Patient Ratio above and the  CHD Risk Table to determine the patient's CHD Risk.        ATP III CLASSIFICATION (LDL):  <100     mg/dL   Optimal  213-086  mg/dL   Near or Above                    Optimal  130-159  mg/dL   Borderline  578-469  mg/dL   High  >629     mg/dL   Very High Performed at [facility]     Physical Findings: AIMS: Facial and Oral Movements Muscles of Facial Expression: None, normal Lips and Perioral Area: None, normal Jaw: None, normal Tongue: None, normal,Extremity Movements Upper (arms, wrists, hands, fingers): None, normal Lower (legs, knees, ankles, toes): None, normal, Trunk Movements Neck, shoulders, hips: None, normal, Overall Severity Severity of abnormal movements (highest score from questions above): None, normal Incapacitation due to abnormal movements: None, normal Patient's awareness of abnormal movements (rate only patient's report): No Awareness, Dental Status Current problems with teeth and/or dentures?: No Does patient usually wear dentures?: No  CIWA:  CIWA-Ar Total: 0 COWS:  COWS Total Score: 0  Treatment Plan Summary: Daily contact with patient to assess and evaluate symptoms and progress in treatment Medication management  12/23/13: Per collateral obtained from sister [name] - patient's baby is with her now and she is trying to get custody. However the baby is a trigger for the patient and the patient will not do well with the baby in the same house. Hence pt will be better off if referred for a long term facility. Patient could go to god mother's place but this needs to be discussed with God mother - whose number is Clydia Llano [phone].Case manager - Ms.Kirk - [phone]. Pt also has an ACTT with [employer].     Plan: Patient presented with depression as well as psychosis. Patient with past hx of depression as well as schizoaffective disorder.Her sx started after the death of her parents in 06-04-2008 and 06/04/2009. Patient's recent trigger is the  birth of her newborn baby who is with her sister at this time. Will continue Risperdal but will increase the dose to 3 mg po bid. Plan to give a higher dose of Risperdal Consta -next dose due on 12/26/13. Will continue Depakote 500  mg po bid for mood lability. Depakote level on 12/26/13. Continue Ambien 5 mg po qhs prn for sleep. Reviewed labs -lipid panel abnormal -diet consult. Collateral obtained from sister Leavy CellaJasmine -see above documentation. CSW will work on disposition. Patient to participate in therapeutic milieu.     Medical Decision Making Problem Points:  Established problem, stable/improving (1), Review of last therapy session (1) and Review of psycho-social stressors (1) Data Points:  Order Aims Assessment (2) Review or order clinical lab tests (1) Review and summation of old records (2) Review of medication regiment & side effects (2) Review of new medications or change in dosage (2)  I certify that inpatient services furnished can reasonably be expected to improve the patient's condition.   Taytem Ghattas md 12/24/2013, 12:49 PM

## 2013-12-25 MED ORDER — BENZTROPINE MESYLATE 1 MG/ML IJ SOLN
1.0000 mg | Freq: Once | INTRAMUSCULAR | Status: AC
  Administered 2013-12-26: 1 mg via INTRAMUSCULAR
  Filled 2013-12-25: qty 1
  Filled 2013-12-25: qty 2

## 2013-12-25 MED ORDER — ZOLPIDEM TARTRATE 5 MG PO TABS
5.0000 mg | ORAL_TABLET | Freq: Every day | ORAL | Status: DC
Start: 2013-12-25 — End: 2013-12-28
  Administered 2013-12-25 – 2013-12-26 (×2): 5 mg via ORAL
  Filled 2013-12-25 (×2): qty 1

## 2013-12-25 MED ORDER — RISPERIDONE MICROSPHERES 25 MG IM SUSR
37.5000 mg | INTRAMUSCULAR | Status: DC
  Administered 2013-12-26: 37.5 mg via INTRAMUSCULAR
  Filled 2013-12-25: qty 4

## 2013-12-25 MED ORDER — RISPERIDONE 3 MG PO TABS: 3.5000 mg | ORAL_TABLET | Freq: Two times a day (BID) | ORAL | Status: DC

## 2013-12-25 MED ORDER — RISPERIDONE 1 MG PO TABS
3.5000 mg | ORAL_TABLET | Freq: Every day | ORAL | Status: DC
  Administered 2013-12-25 – 2013-12-26 (×2): 3.5 mg via ORAL
  Filled 2013-12-25 (×5): qty 3.5

## 2013-12-25 MED ORDER — BENZTROPINE MESYLATE 1 MG PO TABS
1.0000 mg | ORAL_TABLET | Freq: Every day | ORAL | Status: DC
  Administered 2013-12-26 – 2013-12-27 (×2): 1 mg via ORAL
  Filled 2013-12-25: qty 1
  Filled 2013-12-25: qty 6
  Filled 2013-12-25 (×2): qty 1
  Filled 2013-12-25: qty 6
  Filled 2013-12-25: qty 1

## 2013-12-25 MED ORDER — ZOLPIDEM TARTRATE 10 MG PO TABS: 10.0000 mg | ORAL_TABLET | Freq: Every evening | ORAL | Status: DC | PRN

## 2013-12-25 MED ORDER — RISPERIDONE 1 MG PO TABS
3.5000 mg | ORAL_TABLET | Freq: Every day | ORAL | Status: DC
  Administered 2013-12-26 – 2013-12-27 (×2): 3.5 mg via ORAL
  Filled 2013-12-25: qty 4
  Filled 2013-12-25 (×5): qty 3.5

## 2013-12-25 MED ORDER — BENZTROPINE MESYLATE 1 MG PO TABS
1.0000 mg | ORAL_TABLET | Freq: Every day | ORAL | Status: DC
  Administered 2013-12-25 – 2013-12-26 (×2): 1 mg via ORAL
  Filled 2013-12-25 (×5): qty 1

## 2013-12-25 NOTE — Progress Notes (Signed)
D: Pt denies SI/HI/AVH. Pt is pleasant and cooperative. Pt continues to be in denial about her situation. Pt stated she is supposed to go to [place] to live with her mother. Pt continues to have minimal interaction on the unit, butappeared to get along with her roommate.   A: Pt was offered support and encouragement. Pt was given scheduled medications. Pt was encourage to attend groups. Q 15 minute checks were done for safety.   R:Pt attends groups  Pt is taking medication. Pt has no complaints at this time .Pt receptive to treatment and safety maintained on unit.

## 2013-12-25 NOTE — Progress Notes (Signed)
[facility] MD Progress Note  12/25/2013 2:04 PM Joshlynn Alfonzo  MRN:  [id number] Subjective:  Patient states,'I am ok". Objective: Patient seen and chart reviewed. Patient today appears to be improving , continues to be focused on DC . Patient however continues to have pressured speech, is restless ,making several visits to the hallway throughout the day to call family on the phone and seen on the phone too often. Patient also reports not sleeping last night ,reports drinking a lot of coffee and not taking her PRN ambien. Per nursing patient improving ,pt went to bed early last night and later on was up at night ,packing her stuff and talking to her room mate.  Denies SI/HI/AH/VH. Patient wants to stay on Risperdal and Risperdal Consta. Plan to give Risperdal consta tomorrow.  Per staff ,patient is attending groups ,is more pleasant and is compliant on medications.Patient denies any side effects. Patient denies any rigidity ,stiffness or tremors.   Diagnosis:   DSM5: Primary Psychiatric Diagnosis: Schizoaffective disorder,depressed type,multiple episodes ,currently in acute episode     Non Psychiatric Diagnosis: See PMH     Total Time spent with patient: 45 minutes   ADL's:  Intact  Sleep: Fair  Appetite:  Fair    Psychiatric Specialty Exam: Physical Exam  ROS  Blood pressure 141/92, pulse 96, temperature 98.4 F (36.9 C), temperature source Oral, resp. rate 16, height 5\' 5"  (1.651 m), weight 107.502 kg (237 lb), last menstrual period 12/06/2013, SpO2 100 %.Body mass index is 39.44 kg/(m^2).  General Appearance: Casual  Eye Contact::  Fair  Speech:  Pressured improving  Volume:  Normal  Mood:  Anxious and Depressedimproving  Affect:  Labile  Thought Process:  Circumstantial  Orientation:  Full (Time, Place, and Person)  Thought Content:  WDL  Suicidal Thoughts:  No  Homicidal Thoughts:  No  Memory:  Immediate;   Fair Recent;   Fair Remote;   Fair  Judgement:  Fair   Insight:  Shallow  Psychomotor Activity:  Increased  Concentration:  Fair  Recall:  Poor  Fund of Knowledge:Fair  Language: Fair  Akathisia:  No  Handed:  Right  AIMS (if indicated):     Assets:  Desire for Improvement Housing  Sleep:  Number of Hours: 0   Musculoskeletal: Strength & Muscle Tone: within normal limits Gait & Station: normal Patient leans: N/A  Current Medications: Current Facility-Administered Medications  Medication Dose Route Frequency Provider Last Rate Last Dose  . acetaminophen (TYLENOL) tablet 650 mg  650 mg Oral Q6H PRN Nanine Means, NP   650 mg at 12/24/13 0813  . alum & mag hydroxide-simeth (MAALOX/MYLANTA) 200-200-20 MG/5ML suspension 30 mL  30 mL Oral Q4H PRN Nanine Means, NP      . Melene Muller ON 12/26/2013] benztropine (COGENTIN) tablet 1 mg  1 mg Oral Daily Saul Dorsi, MD      . benztropine (COGENTIN) tablet 1 mg  1 mg Oral QHS Allisen Pidgeon, MD      . Melene Muller ON 12/26/2013] benztropine mesylate (COGENTIN) injection 1 mg  1 mg Intramuscular Once Abbott Jasinski, MD      . divalproex (DEPAKOTE) DR tablet 500 mg  500 mg Oral BID PC Nanine Means, NP   500 mg at 12/25/13 0813  . ibuprofen (ADVIL,MOTRIN) tablet 600 mg  600 mg Oral Q8H PRN Nanine Means, NP      . LORazepam (ATIVAN) tablet 1 mg  1 mg Oral Q8H PRN Nanine Means, NP   1 mg at 12/23/13 0630  .  magnesium hydroxide (MILK OF MAGNESIA) suspension 30 mL  30 mL Oral Daily PRN Nanine MeansJamison Lord, NP      . nicotine (NICODERM CQ - dosed in mg/24 hours) patch 21 mg  21 mg Transdermal Daily Nanine MeansJamison Lord, NP   21 mg at 12/25/13 0814  . ondansetron (ZOFRAN) tablet 4 mg  4 mg Oral Q8H PRN Nanine MeansJamison Lord, NP      . risperiDONE (RISPERDAL) tablet 3.5 mg  3.5 mg Oral QHS Jomarie LongsSaramma Daisey Caloca, MD      . Melene Muller[START ON 12/26/2013] risperiDONE (RISPERDAL) tablet 3.5 mg  3.5 mg Oral Daily Jomarie LongsSaramma Blu Lori, MD      . Melene Muller[START ON 12/26/2013] risperiDONE microspheres (RISPERDAL CONSTA) injection 37.5 mg  37.5 mg Intramuscular Q14 Days Kaeo Jacome, MD      . zolpidem (AMBIEN) tablet 5 mg  5 mg Oral QHS Jomarie LongsSaramma Shaylie Eklund, MD        Lab Results:  Results for orders placed or performed during the hospital encounter of 12/21/13 (from the past 48 hour(s))  TSH     Status: None   Collection Time: 12/24/13  6:25 AM  Result Value Ref Range   TSH 1.790 0.350 - 4.500 uIU/mL    Comment: Performed at [facility]  Lipid panel     Status: Abnormal   Collection Time: 12/24/13  6:25 AM  Result Value Ref Range   Cholesterol 178 0 - 200 mg/dL   Triglycerides 536155 (H) <150 mg/dL   HDL 30 (L) >64>39 mg/dL   Total CHOL/HDL Ratio 5.9 RATIO   VLDL 31 0 - 40 mg/dL   LDL Cholesterol 403117 (H) 0 - 99 mg/dL    Comment:        Total Cholesterol/HDL:CHD Risk Coronary Heart Disease Risk Table                     Men   Women  1/2 Average Risk   3.4   3.3  Average Risk       5.0   4.4  2 X Average Risk   9.6   7.1  3 X Average Risk  23.4   11.0        Use the calculated Patient Ratio above and the CHD Risk Table to determine the patient's CHD Risk.        ATP III CLASSIFICATION (LDL):  <100     mg/dL   Optimal  [id number]  mg/dL   Near or Above                    Optimal  130-159  mg/dL   Borderline  [id number]  mg/dL   High  >643>190     mg/dL   Very High Performed at [facility]   Hemoglobin A1c     Status: Abnormal   Collection Time: 12/24/13  6:25 AM  Result Value Ref Range   Hgb A1c MFr Bld 5.7 (H) <5.7 %    Comment: (NOTE)                                                                       According to the ADA Clinical Practice Recommendations for 2011, when HbA1c is used as a screening  test:  >=6.5%   Diagnostic of Diabetes Mellitus           (if abnormal result is confirmed) 5.7-6.4%   Increased risk of developing Diabetes Mellitus References:Diagnosis and Classification of Diabetes Mellitus,Diabetes Care,2011,34(Suppl 1):S62-S69 and Standards of Medical Care in         Diabetes - 2011,Diabetes Care,2011,34 (Suppl  1):S11-S61.    Mean Plasma Glucose 117 (H) <117 mg/dL    Comment: Performed at [facility]    Physical Findings: AIMS: Facial and Oral Movements Muscles of Facial Expression: None, normal Lips and Perioral Area: None, normal Jaw: None, normal Tongue: None, normal,Extremity Movements Upper (arms, wrists, hands, fingers): None, normal Lower (legs, knees, ankles, toes): None, normal, Trunk Movements Neck, shoulders, hips: None, normal, Overall Severity Severity of abnormal movements (highest score from questions above): None, normal Incapacitation due to abnormal movements: None, normal Patient's awareness of abnormal movements (rate only patient's report): No Awareness, Dental Status Current problems with teeth and/or dentures?: No Does patient usually wear dentures?: No  CIWA:  CIWA-Ar Total: 0 COWS:  COWS Total Score: 0  Treatment Plan Summary: Daily contact with patient to assess and evaluate symptoms and progress in treatment Medication management  12/23/13: Per collateral obtained from sister Leavy CellaJasmine - patient's baby is with her now and she is trying to get custody. However the baby is a trigger for the patient and the patient will not do well with the baby in the same house. Hence pt will be better off if referred for a long term facility. Patient could go to god mother's place but this needs to be discussed with God mother - whose number is Clydia LlanoGale Ray [phone]- [phone].Case manager - Ms.Kirk - [phone]. Pt also has an [facility] with [facility].     Plan: Patient presented with depression as well as psychosis. Patient with past hx of depression as well as schizoaffective disorder.Her sx started after the death of her parents in 2010 and 2011. Patient's recent trigger is the birth of her newborn baby who is with her sister at this time. Will continue Risperdal but will increase the dose to 3.5 mg po bid. Will give Risperdal Consta 37.5 mg po x1 dose tomorrow . Patient  to get it every 2 weeks. AIMS -0 (12/25/13) Will continue Depakote 500 mg po bid for mood lability. Depakote level on 12/26/13. Continue Ambien 5 mg po qhs  for sleep. Collateral obtained from sister Leavy CellaJasmine -see above documentation. CSW will work on disposition. Patient to participate in therapeutic milieu.     Medical Decision Making Problem Points:  Established problem, stable/improving (1), Review of last therapy session (1) and Review of psycho-social stressors (1) Data Points:  Order Aims Assessment (2) Review or order clinical lab tests (1) Review of medication regiment & side effects (2) Review of new medications or change in dosage (2)  I certify that inpatient services furnished can reasonably be expected to improve the patient's condition.   Ambre Kobayashi md 12/25/2013, 2:04 PM

## 2013-12-25 NOTE — [facility] Group Notes (Signed)
[facility] LCSW Group Therapy  12/25/2013 2:16 PM  Type of Therapy:  Group Therapy  Participation Level:  Active  Participation Quality:  Attentive  Affect:  Appropriate  Cognitive:  Alert  Insight:  Improving  Engagement in Therapy:  Improving  Modes of Intervention: Discussion, Problem solving, Socialization, and Support   Summary of Progress/Problems: MHA Speaker came to talk about his personal journey with substance abuse and mental illness. The pt processed ways by which to relate to the speaker. MHA speaker provided handouts and educational information pertaining to groups and services offered by the [facility]. Irving Burtonmily was attentive during group but did not ask questions of speaker. Pt brightened when speaker played guitar.    Smart, Alem Fahl LCSWA  12/25/2013, 2:16 PM

## 2013-12-25 NOTE — [facility] Group Notes (Signed)
[facility]   12/25/2013 9:59 AM  Participation Quality:  Appropriate   Mood/Affect:  Appropriate  Depression Rating:  0  Anxiety Rating:  "I miss my son. I'm anxious to get discharged and get things rolling."   Thoughts of Suicide:  No Will you contract for safety?   NA  Current AVH:  No  Plan for Discharge/Comments:  Pt reports that she is working with [facility] [facility] and with godmother to secure housing in [place]. Pt plans to stay in extended stay hotel at d/c (in [place]) and shared that her sister agreed to drive her to [place]. Pt acknowledged that she needs to get herself a stable home and job before she is able to care for her son, who is currently being cared for by her sister in [place].   Transportation Means: sister/family member  Supports:sister, godmother; there is some family conflict. Pt encouraged by CSW to communicate with family members that are involved in her d/c plan to get everyone on the same page.   Smart, American FinancialHeather LCSWA

## 2013-12-25 NOTE — Progress Notes (Signed)
D: Patient denies SI/HI and A/V hallucinations; patient reports sleep was poor and that she did receive something to make her sleep; reports appetite is good; reports energy level is normal ; reports ability to concentrate is good; rates depression as 1/10; rates hopelessness 1/10; rates anxiety as 1/10;   A: Monitored q 15 minutes; patient encouraged to attend groups; patient educated about medications; patient given medications per physician orders; patient encouraged to express feelings and/or concerns  R: Patient really would like to go home and that is very important to her to work on today; patient is pleasant and cooperative; patient engages but her concern is her plan when she leaves; patient's interaction with staff and peers is appropriate; patient was able to set goal to talk with staff 1:1 when having feelings of SI; patient is taking medications as prescribed and tolerating medications; patient is attending all groups

## 2013-12-26 LAB — VALPROIC ACID LEVEL: VALPROIC ACID LVL: 51.1 ug/mL (ref 50.0–100.0)

## 2013-12-26 MED ORDER — DIVALPROEX SODIUM 500 MG PO DR TAB
750.0000 mg | DELAYED_RELEASE_TABLET | Freq: Two times a day (BID) | ORAL | Status: DC
  Administered 2013-12-26 – 2013-12-27 (×2): 750 mg via ORAL
  Filled 2013-12-26 (×10): qty 1

## 2013-12-26 NOTE — Progress Notes (Signed)
Adult Psychoeducational Group Note  Date:  12/26/2013 Time:  10:58 AM  Group Topic/Focus:  Self Esteem Action Plan:   The focus of this group is to help patients create a plan to continue to build self-esteem after discharge.  Participation Level:  Active  Participation Quality:  Attentive and Sharing  Affect:  Appropriate  Cognitive:  Appropriate  Insight: Good  Engagement in Group:  Engaged  Modes of Intervention:  Discussion and Education  Additional Comments:  Pt was able to share positively with the group. Pt was also concerned about being discharged in order to be reunited with her baby.   Brandy Sweeney E 12/26/2013, 10:58 AM

## 2013-12-26 NOTE — Progress Notes (Signed)
[facility] Group Notes:  (Nursing/MHT/Case Management/Adjunct)  Date:  12/26/2013  Time:  10:10 PM  Type of Therapy:  Group Therapy  Participation Level:  Minimal  Participation Quality:  Appropriate  Affect:  Appropriate  Cognitive:  Appropriate  Insight:  Appropriate  Engagement in Group:  Engaged  Modes of Intervention:  Socialization and Support  Summary of Progress/Problems: Pt. Stated her energy level was a 9.  Pt. Stated playing and listening to music were ways to cope with stress.  Sondra ComeWilson, Allyn Bartelson J 12/26/2013, 10:10 PM

## 2013-12-26 NOTE — Progress Notes (Signed)
Patient ID: Brandy Sweeney, female   DOB: 08/12/1990, 23 y.o.   MRN: [id number]   D: Pt currently presents with a blunted affect and depressed behavior. Per self inventory, pt rates depression at a 1, hopelessness 0 and anxiety 0. Pt's daily goal is "going home" and they intend to do so by "go to group." Pt reports good sleep, concentration and appetite. Pt has been attending group and socializing with roommate. Pt has inappropriate emotional responses as evident by her affect.    A: Pt provided with medications per providers orders. Pt's labs and vitals were monitored throughout the day. Pt supported emotionally and encouraged to express concerns and questions. Pt consulted with provider and Clinical research associatewriter. Pt educated on medications, coping skills and discharge plan.   R: Pt's safety ensured with 15 minute and environmental checks. Pt currently denies SI/HI and A/V hallucinations. Pt verbally agrees to seek staff if SI/HI or A/VH occurs and to consult with staff before acting on these thoughts. Pt reports still having cravings.    Aurora Maskwyman, Jarrius Huaracha E, RN

## 2013-12-26 NOTE — Progress Notes (Signed)
NUTRITION ASSESSMENT  RD consulted for diet education regarding hyperlipidemia and Hx of gestational diabetes.  INTERVENTION: 1. Educated patient on the importance of nutrition and encouraged intake of food and beverages. 2. Discussed weight goals. 3. Supplements: none  NUTRITION DIAGNOSIS: Food and nutrition-related knowledge deficit related to hyperlipidemia as evidenced by TG levels of 155.  Goal: Pt to meet >/= 90% of their estimated nutrition needs.  Monitor:  PO intake  Assessment:  23 y.o. Female with long history of mental illness, history of PTSD, Post partum depression and Schizoaffective disorder.   Pt reports adequate appetite currently and PTA.  Pt reports drinking a lot of caffeinated beverages and fast food.  Encouraged fresh fruits and vegetables as well as whole grain sources of carbohydrates to maximize fiber intake.   Discussed with pt the importance of eating 3 meals a day with snacks, emphasizing protein consumption. Discussed the importance of good nutrition for mental health and aiding in depression and anxiety. Discussed the effects of caffeine on anxiety and depression levels.   Lipid Panel     Component Value Date/Time   CHOL 178 12/24/2013 0625   TRIG 155* 12/24/2013 0625   HDL 30* 12/24/2013 0625   CHOLHDL 5.9 12/24/2013 0625   VLDL 31 12/24/2013 0625   LDLCALC 117* 12/24/2013 0625   Height: Ht Readings from Last 1 Encounters:  12/21/13 5\' 5"  (1.651 m)    Weight: Wt Readings from Last 1 Encounters:  12/21/13 237 lb (107.502 kg)    Weight Hx: Wt Readings from Last 10 Encounters:  12/21/13 237 lb (107.502 kg)    BMI:  Body mass index is 39.44 kg/(m^2). Pt meets criteria for obesity based on current BMI.  Estimated Nutritional Needs: Kcal: 25-30 kcal/kg Protein: > 1 gram protein/kg Fluid: 1 ml/kcal  Diet Order: Diet Heart Pt is also offered choice of unit snacks mid-morning and mid-afternoon.  Pt is eating as desired.   Lab  results and medications reviewed.   Brandy FrancoLindsey Mariadejesus Cade, MS, RD, LDN Pager: [phone] After Hours Pager: [phone]

## 2013-12-26 NOTE — Progress Notes (Addendum)
[facility] MD Progress Note  12/26/2013 11:28 AM Brandy Sweeney  MRN:  [id number] Subjective:  Patient states,'I am better". Objective: Patient seen and chart reviewed. Patient today has been showing some improvement with her pressured speech as well as manic sx. Patient is less anxious today ,focused on her plan to get and apartment as well as job. Patient had some improvement with her sleep last night. Patient however continues to have some restlessness periodically. Patient denies SI/HI ,continues to have flight of ideas and tangential thought process ,but she is over all improving.  CSW to discuss with family regarding housing as well as disposition. Patient's sister has been trying to get temporary guardianship of the patient's newborn baby since pt is not capable of providing for the child right now.Patient still has to come to terms with that plan .  Patient to receive her Risperdal consta 37.5 mg IM today,nxt dose in 14 days. Got depakote level which is subtherapeutic. Will increase depakote dose today.  Per staff ,patient is attending groups ,is more pleasant and is compliant on medications.Patient denies any side effects. Patient denies any rigidity ,stiffness or tremors.   Diagnosis:   DSM5: Primary Psychiatric Diagnosis: Schizoaffective disorder,depressed type,multiple episodes ,currently in acute episode     Non Psychiatric Diagnosis: See PMH     Total Time spent with patient: 45 minutes   ADL's:  Intact  Sleep: Fair  Appetite:  Fair    Psychiatric Specialty Exam: Physical Exam  ROS  Blood pressure 119/67, pulse 117, temperature 97.8 F (36.6 C), temperature source Oral, resp. rate 18, height 5\' 5"  (1.651 m), weight 107.502 kg (237 lb), last menstrual period 12/06/2013, SpO2 100 %.Body mass index is 39.44 kg/(m^2).  General Appearance: Casual  Eye Contact::  Fair  Speech:  Pressured improving  Volume:  Normal  Mood:  Anxious and Depressedimproving  Affect:   Labile  Thought Process:  Circumstantial and Tangential  Orientation:  Full (Time, Place, and Person)  Thought Content:  WDL  Suicidal Thoughts:  No  Homicidal Thoughts:  No  Memory:  Immediate;   Fair Recent;   Fair Remote;   Fair  Judgement:  Fair  Insight:  Shallow  Psychomotor Activity:  Increased  Concentration:  Fair  Recall:  Poor  Fund of Knowledge:Fair  Language: Fair  Akathisia:  No  Handed:  Right  AIMS (if indicated):     Assets:  Desire for Improvement Housing  Sleep:  Number of Hours: 5.25   Musculoskeletal: Strength & Muscle Tone: within normal limits Gait & Station: normal Patient leans: N/A  Current Medications: Current Facility-Administered Medications  Medication Dose Route Frequency Provider Last Rate Last Dose  . acetaminophen (TYLENOL) tablet 650 mg  650 mg Oral Q6H PRN Nanine Means, NP   650 mg at 12/24/13 0813  . alum & mag hydroxide-simeth (MAALOX/MYLANTA) 200-200-20 MG/5ML suspension 30 mL  30 mL Oral Q4H PRN Nanine Means, NP      . benztropine (COGENTIN) tablet 1 mg  1 mg Oral Daily Jomarie Longs, MD   1 mg at 12/26/13 0758  . benztropine (COGENTIN) tablet 1 mg  1 mg Oral QHS Jomarie Longs, MD   1 mg at 12/25/13 2230  . divalproex (DEPAKOTE) DR tablet 750 mg  750 mg Oral BID PC Bruk Tumolo, MD      . ibuprofen (ADVIL,MOTRIN) tablet 600 mg  600 mg Oral Q8H PRN Nanine Means, NP      . LORazepam (ATIVAN) tablet 1 mg  1 mg  Oral Q8H PRN Nanine MeansJamison Lord, NP   1 mg at 12/23/13 0630  . magnesium hydroxide (MILK OF MAGNESIA) suspension 30 mL  30 mL Oral Daily PRN Nanine MeansJamison Lord, NP      . nicotine (NICODERM CQ - dosed in mg/24 hours) patch 21 mg  21 mg Transdermal Daily Nanine MeansJamison Lord, NP   21 mg at 12/26/13 0757  . ondansetron (ZOFRAN) tablet 4 mg  4 mg Oral Q8H PRN Nanine MeansJamison Lord, NP      . risperiDONE (RISPERDAL) tablet 3.5 mg  3.5 mg Oral QHS Jomarie LongsSaramma Debroah Shuttleworth, MD   3.5 mg at 12/25/13 2230  . risperiDONE (RISPERDAL) tablet 3.5 mg  3.5 mg Oral Daily Jomarie LongsSaramma  Amazin Pincock, MD   3.5 mg at 12/26/13 0758  . risperiDONE microspheres (RISPERDAL CONSTA) injection 37.5 mg  37.5 mg Intramuscular Q14 Days Jomarie LongsSaramma Shenae Bonanno, MD   37.5 mg at 12/26/13 1117  . zolpidem (AMBIEN) tablet 5 mg  5 mg Oral QHS Jomarie LongsSaramma Nicle Connole, MD   5 mg at 12/25/13 2230    Lab Results:  Results for orders placed or performed during the hospital encounter of 12/21/13 (from the past 48 hour(s))  Valproic acid level     Status: None   Collection Time: 12/26/13  6:31 AM  Result Value Ref Range   Valproic Acid Lvl 51.1 50.0 - 100.0 ug/mL    Comment: Performed at [facility]    Physical Findings: AIMS: Facial and Oral Movements Muscles of Facial Expression: None, normal Lips and Perioral Area: None, normal Jaw: None, normal Tongue: None, normal,Extremity Movements Upper (arms, wrists, hands, fingers): None, normal Lower (legs, knees, ankles, toes): None, normal, Trunk Movements Neck, shoulders, hips: None, normal, Overall Severity Severity of abnormal movements (highest score from questions above): None, normal Incapacitation due to abnormal movements: None, normal Patient's awareness of abnormal movements (rate only patient's report): No Awareness, Dental Status Current problems with teeth and/or dentures?: No Does patient usually wear dentures?: No  CIWA:  CIWA-Ar Total: 0 COWS:  COWS Total Score: 0  Treatment Plan Summary: Daily contact with patient to assess and evaluate symptoms and progress in treatment Medication management  12/23/13: Per collateral obtained from sister Leavy CellaJasmine - patient's baby is with her now and she is trying to get custody. However the baby is a trigger for the patient and the patient will not do well with the baby in the same house. Hence pt will be better off if referred for a long term facility. Patient could go to god mother's place but this needs to be discussed with God mother - whose number is Clydia LlanoGale Ray [phone]- [phone].Case manager - Ms.Kirk -  [phone]. Pt also [facility].     Plan: Patient presented with depression as well as psychosis. Patient with past hx of depression as well as schizoaffective disorder.Her sx started after the death of her parents in 2010 and 2011. Patient's recent trigger is the birth of her newborn baby who is with her sister at this time. Will continue Risperdal 3.5 mg po bid. Will give Risperdal Consta 37.5 mg po x1 today . Patient to get it every 2 weeks. AIMS -0 (12/25/13) Will increase Depakote to 750 mg po bid for mood lability. Depakote level on 12/26/13 -51.1 Continue Ambien 5 mg po qhs  for sleep. Collateral obtained from sister Leavy CellaJasmine -see above documentation. CSW will work on disposition.Plan to discharge patient tomorrow if she continues to improve since we are still working on her medications  today . Patient to participate in therapeutic milieu.     Medical Decision Making Problem Points:  Established problem, stable/improving (1), Review of last therapy session (1) and Review of psycho-social stressors (1) Data Points:  Review or order clinical lab tests (1) Review of medication regiment & side effects (2) Review of new medications or change in dosage (2)  I certify that inpatient services furnished can reasonably be expected to improve the patient's condition.   Sylvan Lahm md 12/26/2013, 11:28 AM

## 2013-12-26 NOTE — Progress Notes (Signed)
D: Pt denies SI/HI/AHV. Pt is pleasant and cooperative. Pt stayed in bed much of the evening. Pt had little interaction on the unit.   A: Pt was offered support and encouragement. Pt was given scheduled medications. Pt was encourage to attend groups. Q 15 minute checks were done for safety.   R: Pt is taking medication. Pt has no complaints.Pt receptive to treatment and safety maintained on unit.

## 2013-12-26 NOTE — Clinical Social Work Note (Signed)
CSW spoke with pt's sister and god mother to gain clarity on d/c plan. Pt's sister will pick her up tomorrow afternoon (Friday) at d/c and will drive her to [place]. From there, pt plans to stay in extended stay hotel until her god mother can secure apt for her (1-2 weeks). Her ACT team [facility] will see her for services immediately. Per pt's sister, Brandy Sweeney is responsible for getting necessary funds to pay for extended stay hotel and stated that pt is aware of this. CSW to check in with pt after group.   The Sherwin-WilliamsHeather Smart, LCSWA 12/26/2013 10:16 AM

## 2013-12-26 NOTE — Progress Notes (Signed)
Adult Psychoeducational Group Note  Date:  12/26/2013 Time: 0900  Group Topic/Focus:  Goals Group:   The focus of this group is to help patients establish daily goals to achieve during treatment and discuss how the patient can incorporate goal setting into their daily lives to aide in recovery.  Participation Level:  Active  Participation Quality:  Appropriate  Affect:  Appropriate  Cognitive:  Appropriate  Insight: Appropriate  Engagement in Group:  Engaged  Modes of Intervention:  Discussion and Education  Additional Comments:   Jb Dulworth L 12/26/2013, 1:12 PM

## 2013-12-26 NOTE — [facility] Group Notes (Signed)
[facility] LCSW Group Therapy  12/26/2013 1:28 PM   Type of Therapy:  Group Therapy  Participation Level:  Active  Participation Quality:  Attentive  Affect:  Appropriate  Cognitive:  Appropriate  Insight:  Improving  Engagement in Therapy:  Engaged  Modes of Intervention:  Clarification, Education, Exploration and Socialization  Summary of Progress/Problems: Today's group focused on relapse prevention.  We defined the term, and then brainstormed on ways to prevent relapse.  "For recovery, I need to focus on positive things for me, like getting rest, and getting exercise.  Yawning a lot and closing eyes sometimes.  Limited participation.  Brandy Sweeney, Brandy Sweeney B 12/26/2013 , 1:28 PM

## 2013-12-27 DIAGNOSIS — F251 Schizoaffective disorder, depressive type: Secondary | ICD-10-CM | POA: Insufficient documentation

## 2013-12-27 MED ORDER — DIVALPROEX SODIUM 250 MG PO DR TAB
750.0000 mg | DELAYED_RELEASE_TABLET | Freq: Two times a day (BID) | ORAL | Status: DC
  Filled 2013-12-27 (×2): qty 18

## 2013-12-27 MED ORDER — DIVALPROEX SODIUM 250 MG PO DR TAB: 750.0000 mg | DELAYED_RELEASE_TABLET | Freq: Two times a day (BID) | ORAL | Status: DC

## 2013-12-27 MED ORDER — RISPERIDONE MICROSPHERES 25 MG IM SUSR
37.5000 mg | INTRAMUSCULAR | Status: DC
Start: 2013-12-25 — End: 2019-03-16

## 2013-12-27 MED ORDER — RISPERIDONE 2 MG PO TABS: ORAL_TABLET | ORAL | Status: DC

## 2013-12-27 MED ORDER — RISPERIDONE 2 MG PO TABS
3.0000 mg | ORAL_TABLET | Freq: Every day | ORAL | Status: DC
  Filled 2013-12-27: qty 14

## 2013-12-27 MED ORDER — ZOLPIDEM TARTRATE 5 MG PO TABS: 5.0000 mg | ORAL_TABLET | Freq: Every day | ORAL | Status: DC

## 2013-12-27 MED ORDER — BENZTROPINE MESYLATE 1 MG PO TABS: 1.0000 mg | ORAL_TABLET | Freq: Every day | ORAL | Status: DC
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
## 2013-12-27 NOTE — [facility] Group Notes (Signed)
[facility]   12/27/2013 11:31 AM  Participation Quality:  Appropriate   Mood/Affect:  Appropriate  Depression Rating:  0  Anxiety Rating:  0  Thoughts of Suicide:  No Will you contract for safety?   NA  Current AVH:  No  Plan for Discharge/Comments:  Pt reports that she will be staying with her sister for short term until long term living arrangements can be made. Her ACT team is aware and is working with there to link [place]. [facility] the [facility].   Transportation Means: sister coming at 5:30PM   Supports: sister, some family supports identified-mother, grandparents (live in [place])   Counselling psychologistmart, OncologistHeather LCSWA

## 2013-12-27 NOTE — Progress Notes (Signed)
Patient ID: Brandy Sweeney, female   DOB: 01/16/1991, 23 y.o.   MRN: [id number]  D: Pt currently presents with a blunted affect and pleasant behavior. Per self inventory, pt rates depression at a 0, hopelessness 0 and anxiety 10. Pt's daily goal is to "I don't know" and they intend to do so by "I don't know." Pt reports poor sleep, concentration and a fair appetite.    A; Pt provided with medications per providers orders. Pt's labs and vitals were monitored throughout the day. Pt supported emotionally and encouraged to express concerns and questions. Pt consulted with provider and Clinical research associatewriter. Pt educated on coping skills and medications.   R: Pt's safety ensured with 15 minute and environmental checks. Pt currently denies SI/HI and A/V hallucinations. Pt verbally agrees to seek staff if SI/HI or A/VH occurs and to consult with staff before acting on these thoughts. Pt states, "I'm looking forward to going home."   Twyman, Sung AmabileSara E, RN

## 2013-12-27 NOTE — [facility] Suicide Risk Assessment (Signed)
   Demographic Factors:  Low socioeconomic status  Total Time spent with patient: 45 minutes  Psychiatric Specialty Exam: Physical Exam  ROS  Blood pressure 90/66, pulse 130, temperature 97.6 F (36.4 C), temperature source Oral, resp. rate 18, height 5\' 5"  (1.651 m), weight 107.502 kg (237 lb), last menstrual period 12/06/2013, SpO2 100 %.Body mass index is 39.44 kg/(m^2).  General Appearance: Casual  Eye Contact::  Good  Speech:  Clear and Coherent  Volume:  Normal  Mood:  Euthymic  Affect:  Congruent  Thought Process:  Goal Directed  Orientation:  Full (Time, Place, and Person)  Thought Content:  WDL  Suicidal Thoughts:  No  Homicidal Thoughts:  No  Memory:  Immediate;   Fair Recent;   Fair Remote;   Fair  Judgement:  Fair  Insight:  Fair  Psychomotor Activity:  Normal  Concentration:  Good  Recall:  Fair  Fund of Knowledge:Good  Language: Good  Akathisia:  No  Handed:  Right  AIMS (if indicated):     Assets:  Communication Skills Desire for Improvement Physical Health Social Support  Sleep:  Number of Hours: 5.25    Musculoskeletal: Strength & Muscle Tone: within normal limits Gait & Station: normal Patient leans: N/A   Mental Status Per Nursing Assessment::   On Admission:  NA  Current Mental Status by Physician: Patient denies SI/HI/AH/VH,pt is less manic ,has less pressured speech and is less hyperactive.  Loss Factors: NA  Historical Factors: Impulsivity  Risk Reduction Factors:   Responsible for children under 23 years of age, Living with another person, especially a relative and Positive social support  Continued Clinical Symptoms:  Previous Psychiatric Diagnoses and Treatments  Cognitive Features That Contribute To Risk:  Patient is alert ,oriented x4.    Suicide Risk:  Minimal: No identifiable suicidal ideation.    Discharge Diagnoses:  Primary Psychiatric Diagnosis: Schizoaffective disorder,depressed type,multiple episodes  ,currently in acute episode (IMPROVING)     Non Psychiatric Diagnosis: See PMH    Past Medical History  Diagnosis Date  . Schizo affective schizophrenia   . PTSD (post-traumatic stress disorder)   . Postpartum depression     Plan Of Care/Follow-up recommendations: Patient received  RISPERDAL CONSTA 37.5 MG IM X 1 dose on 12/26/13 ,next dose in 14 days.She has an ACTT. Activity:  no restrictions Diet: Heart  Is patient on multiple antipsychotic therapies at discharge:  No   Has Patient had three or more failed trials of antipsychotic monotherapy by history:  No  Recommended Plan for Multiple Antipsychotic Therapies: NA    Rahma Meller MD 12/27/2013, 9:25 AM

## 2013-12-27 NOTE — [facility] Group Notes (Signed)
[facility] LCSW Group Therapy  12/27/2013 1:31 PM  Type of Therapy:  Group Therapy  Participation Level:  Active  Participation Quality:  Attentive  Affect:  Appropriate  Cognitive:  Alert  Insight:  Improving  Engagement in Therapy:  Improving  Modes of Intervention:  Discussion, Education, Exploration, Limit-setting, Problem-solving, Rapport Building, Dance movement psychotherapisteality Testing, Socialization and Support  Summary of Progress/Problems: OrthoptistChaplain and intern facilitated today's group. Group members were asked to choose a card that represented how they feel today. Group members were then asked to share why they chose these card and what the picture symbolized to them. Group members were encouraged to process each members' card and how it relates to their experience. Brandy Sweeney was attentive and engaged during today's processing group. She shared her card with the group. The card was a picture of stone feet (one large and one small). Brandy Sweeney stated that this picture made her feel happy because it reminded her of her 683 month old son. Brandy Sweeney brightened when taking about her son and shared that she is anxious to return home to him "and just be mommy again--give him baths, feed him, and put him to sleep at night."   Smart, [name] LCSWA 12/27/2013, 1:31 PM

## 2013-12-27 NOTE — Progress Notes (Signed)
[facility] :  Will you be returning to the same living situation after discharge: No.Pt will be staying with her sister, Leavy CellaJasmine until long term living arrangements can be established.  At discharge, do you have transportation home?:Yes,  pt's sister coming at 5:30PM on 12/27/13 Do you have the ability to pay for your medications: Yes, medicaid  Release of information consent forms completed and submitted to Medical Records by CSW.  Patient to Follow up at: Follow-up Information    [facility] with [facility].   Why:  Social worker spoke with Kathie Rhodesawana on ACT team who will assist patient with getting connected to ACT services in [place]. Please call ACTT with new address.    Contact information:   [address] [place], [place] [address] Phone: [phone] Fax: [phone]      [facility] with [facility].   Why:  Walk in between 8am-9am Monday through Friday for hospital follow-up/medication management/assessment for therapy and ACTT services.    Contact information:   [address] [place], [place] [address] Phone: [phone] Fax: [phone]      Patient denies SI/HI:   Yes,  during group/self report    Safety Planning and Suicide Prevention discussed:  Yes,  SPE completed with pt's godmother, Dondra SpryGail. SPI pamphlet provided to pt and she was encouraged to share information with support network, ask questions, and talk about any concerns relating to SPE.  Smart, Yoshua Geisinger LCSWA  12/27/2013, 10:46 AM

## 2013-12-27 NOTE — Clinical Social Work Note (Signed)
CSW spoke with pt's sister, Leavy CellaJasmine who clarified that pt's godmother is no longer willing to assist pt with finding housing in [place]. [name] will pick up pt at 5:30PM this evening (12/27/13) and plans to allow her to stay at her home until longer term arrangements can be made. CSW contacted pt's ACT team [facility] [phone], spoke with Shelby Dubinawanda, who stated that they would assist pt and her sister in connecting her to ACT team [place]. She requested that in the meantime, pt be [facility] by [facility]. Information provided to pt and her sister. Pt's sister also requested that CSW provide resources in [place] area for housing and for mental health-support groups, possible ACT options, etc. All information placed in pt chart.  The Sherwin-WilliamsHeather Smart, LCSWA 12/27/2013 10:46 AM
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
## 2013-12-27 NOTE — Progress Notes (Signed)
D: Pt was flat in affect but brightens upon interaction. Pt was negative for any SI/HI/AVH. Pt up and present within the milieu this evening. Pt is compliant with her current plan of care.  A: Writer administered scheduled medications to pt, per MD orders. Continued support and availability as needed was extended to this pt. Staff continue to monitor pt with q915min checks.  R: No adverse drug reactions noted. Pt receptive to treatment. Pt remains safe at this time.

## 2013-12-27 NOTE — Progress Notes (Signed)
Patient ID: Jinger Neighborsmily XXXSmith, female   DOB: 11/18/1990, 23 y.o.   MRN: [id number]  Pt discharged home with her sister. Pt was stable and expressed no concerns about dc. All papers and prescriptions were given and valuables returned. Verbal understanding expressed. Housing resources provided by Child psychotherapistsocial worker were given and reviewed. Denies SI/HI and A/VH.    Aurora Maskwyman, Cindie Rajagopalan E, RN

## 2013-12-31 NOTE — Progress Notes (Signed)
Patient Discharge Instructions:  After Visit Summary (AVS):   Faxed to:  12/31/13 Discharge Summary Note:   Faxed to:  12/31/13 Psychiatric Admission Assessment Note:   Faxed to:  12/31/13 Suicide Risk Assessment - Discharge Assessment:   Faxed to:  12/31/13 Faxed/Sent to the Next Level Care provider:  12/31/13 Faxed to [facility]@ [phone] Faxed to [facility] @ [phone]  Jerelene ReddenSheena E Julian, 12/31/2013, 2:54 PM

## 2016-05-13 ENCOUNTER — Ambulatory Visit: Payer: Self-pay

## 2016-05-13 ENCOUNTER — Other Ambulatory Visit: Payer: Self-pay | Admitting: Occupational Medicine

## 2016-05-13 DIAGNOSIS — M79641 Pain in right hand: Secondary | ICD-10-CM

## 2017-05-29 ENCOUNTER — Emergency Department (HOSPITAL_COMMUNITY): Payer: Self-pay

## 2017-05-29 ENCOUNTER — Encounter (HOSPITAL_COMMUNITY): Payer: Self-pay | Admitting: *Deleted

## 2017-05-29 ENCOUNTER — Emergency Department (HOSPITAL_COMMUNITY)
Admission: EM | Admit: 2017-05-29 | Discharge: 2017-05-29 | Disposition: A | Discharge status: Home / Self Care | Payer: Self-pay | Attending: Emergency Medicine | Admitting: Emergency Medicine

## 2017-05-29 ENCOUNTER — Other Ambulatory Visit: Payer: Self-pay

## 2017-05-29 DIAGNOSIS — W19XXXA Unspecified fall, initial encounter: Secondary | ICD-10-CM

## 2017-05-29 DIAGNOSIS — Z79899 Other long term (current) drug therapy: Secondary | ICD-10-CM | POA: Insufficient documentation

## 2017-05-29 DIAGNOSIS — Y939 Activity, unspecified: Secondary | ICD-10-CM | POA: Insufficient documentation

## 2017-05-29 DIAGNOSIS — M25552 Pain in left hip: Secondary | ICD-10-CM | POA: Insufficient documentation

## 2017-05-29 DIAGNOSIS — M79604 Pain in right leg: Secondary | ICD-10-CM | POA: Insufficient documentation

## 2017-05-29 DIAGNOSIS — W108XXA Fall (on) (from) other stairs and steps, initial encounter: Secondary | ICD-10-CM | POA: Insufficient documentation

## 2017-05-29 DIAGNOSIS — M5441 Lumbago with sciatica, right side: Secondary | ICD-10-CM | POA: Insufficient documentation

## 2017-05-29 DIAGNOSIS — Y999 Unspecified external cause status: Secondary | ICD-10-CM | POA: Insufficient documentation

## 2017-05-29 DIAGNOSIS — S0093XA Contusion of unspecified part of head, initial encounter: Secondary | ICD-10-CM | POA: Insufficient documentation

## 2017-05-29 DIAGNOSIS — R52 Pain, unspecified: Secondary | ICD-10-CM

## 2017-05-29 DIAGNOSIS — Y92009 Unspecified place in unspecified non-institutional (private) residence as the place of occurrence of the external cause: Secondary | ICD-10-CM | POA: Insufficient documentation

## 2017-05-29 DIAGNOSIS — M5442 Lumbago with sciatica, left side: Secondary | ICD-10-CM | POA: Insufficient documentation

## 2017-05-29 DIAGNOSIS — F17210 Nicotine dependence, cigarettes, uncomplicated: Secondary | ICD-10-CM | POA: Insufficient documentation

## 2017-05-29 LAB — I-STAT BETA HCG BLOOD, ED (MC, WL, AP ONLY): I-stat hCG, quantitative: <5 m[IU]/mL (ref <5)

## 2017-05-29 MED ORDER — CYCLOBENZAPRINE HCL 10 MG PO TABS: 10.0000 mg | ORAL_TABLET | Freq: Three times a day (TID) | ORAL | 0 refills | Status: DC | PRN

## 2017-05-29 MED ORDER — KETOROLAC TROMETHAMINE 15 MG/ML IJ SOLN: 15.0000 mg | Freq: Once | INTRAMUSCULAR | Status: DC

## 2017-05-29 MED ORDER — MELOXICAM 15 MG PO TABS: 15.0000 mg | ORAL_TABLET | Freq: Every day | ORAL | 1 refills | Status: DC

## 2017-05-29 MED ORDER — SODIUM CHLORIDE 0.9 % IV BOLUS
500.0000 mL | Freq: Once | INTRAVENOUS | Status: AC
  Administered 2017-05-29: 500 mL via INTRAVENOUS

## 2017-05-29 MED ORDER — MORPHINE SULFATE (PF) 4 MG/ML IV SOLN
4.0000 mg | Freq: Once | INTRAVENOUS | Status: AC
  Administered 2017-05-29: 4 mg via INTRAVENOUS
  Filled 2017-05-29: qty 1
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
## 2017-05-29 NOTE — ED Notes (Signed)
Pt is able to stand and walks 3-4 steps in room before she states "it hurts" and returns to stretcher.

## 2017-05-29 NOTE — Discharge Instructions (Addendum)
Images were reassuring.  I would recommend using crutches for weightbearing as tolerated. Please take the mobic as prescribed for pain.  Do not take any additional doses today as you have been given your first dose in the ED.  Do not take any additional NSAIDs including Motrin, naproxen, Aleve, Ibuprofen, Advil. Please the the flexeril for muscle relaxation. This medication will make you drowsy so avoid situation that could place you in danger.  He is follow-up with orthopedist.  Return the ED with any worsening symptoms including inability to urinate, loss of bowel or bladder, numbness in your groin, numbness in your lower extremity's or for any other reason.

## 2017-05-29 NOTE — ED Provider Notes (Signed)
[facility] EMERGENCY DEPARTMENT Provider Note   CSN: [id number] Arrival date & time: 05/29/17  0715     History   Chief Complaint Chief Complaint  Patient presents with  . Fall    HPI Brandy Sweeney is a 27 y.o. female.  HPI 27 year old African-American female past medical history significant for schizoaffective schizophrenia, PTSD that presents to the emergency department today for evaluation following a mechanical fall.  Patient states that she was walking down a set of stairs today when she tripped and fell landing onto her left hip.  Patient states he was approximately 12 steps.  She does report hitting her head but denies LOC.  Initially patient does complain of left-sided hip pain.  States that she has a history of a left hip fracture that required surgical intervention in 2013.  Patient states that now she is having entire right leg pain.  She also reports some low back pain, headache and neck pain.  Patient denies any associated chest pain or abdominal pain.  She has not been ambulatory since the event.  She has not taken anything for the pain.  Range of motion and palpation makes the pain worse.  Nothing makes the pain better.  Patient denies any saddle paresthesias, lower extremity paresthesias, urinary retention, loss of bowel or bladder.  She denies any dizziness, lightheadedness, vision changes.  Denies any shortness of breath, nausea or emesis. Past Medical History:  Diagnosis Date  . Postpartum depression   . PTSD (post-traumatic stress disorder)   . Schizo affective schizophrenia [facility])     Patient Active Problem List   Diagnosis Date Noted  . Schizoaffective disorder, depressive type (HCC)   . Schizoaffective disorder, bipolar type (HCC)   . Postpartum depression   . Bipolar affective disorder, current episode hypomanic (HCC) 12/21/2013  . Schizoaffective disorder, unspecified type [facility])     Past Surgical History:  Procedure Laterality Date  .  CESAREAN SECTION    . HIP SURGERY       OB History   None      Home Medications    Prior to Admission medications   Medication Sig Start Date End Date Taking? Authorizing Provider  benztropine (COGENTIN) 1 MG tablet Take 1 tablet (1 mg total) by mouth daily. 12/27/13   Thermon Leyland, NP  benztropine (COGENTIN) 1 MG tablet Take 1 tablet (1 mg total) by mouth at bedtime. 12/27/13   Thermon Leyland, NP  divalproex (DEPAKOTE) 250 MG DR tablet Take 3 tablets (750 mg total) by mouth 2 (two) times daily after a meal. 12/27/13   Thermon Leyland, NP  risperiDONE (RISPERDAL) 2 MG tablet Take one and half tablets (3 mg) in the morning and two tablets (4 mg) at bedtime. 12/27/13   Thermon Leyland, NP  risperiDONE microspheres (RISPERDAL CONSTA) 25 MG injection Inject 3 mLs (37.5 mg total) into the muscle every 14 (fourteen) days. Next due on 01/08/14 for continuation of treatment 12/25/13   Thermon Leyland, NP  zolpidem (AMBIEN) 5 MG tablet Take 1 tablet (5 mg total) by mouth at bedtime. 12/27/13   Thermon Leyland, NP    Family History No family history on file.  Social History Social History   Tobacco Use  . Smoking status: Current Every Day Smoker    Types: Cigarettes  Substance Use Topics  . Alcohol use: No  . Drug use: No     Allergies   Trazodone and nefazodone   Review of Systems  Review of Systems  All other systems reviewed and are negative.    Physical Exam Updated Vital Signs BP 118/72 (BP Location: Left Arm)   Pulse 76   Temp 98.1 F (36.7 C) (Oral)   Resp 17   SpO2 98%   Physical Exam Physical Exam  Constitutional: Pt is oriented to person, place, and time. Appears well-developed and well-nourished. No distress.  HENT:  Head: Normocephalic and atraumatic.  Ears: No bilateral hemotympanum. Nose: Nose normal. No septal hematoma. Mouth/Throat: Uvula is midline, oropharynx is clear and moist and mucous membranes are normal.  Eyes: Conjunctivae and EOM are normal.  Pupils are equal, round, and reactive to light.  Neck: No spinous process tenderness and no muscular tenderness present. No rigidity. Normal range of motion present.  Full ROM without pain midline cervical tenderness No crepitus, deformity or step-offs bilateral paraspinal tenderness  Cardiovascular: Normal rate, regular rhythm and intact distal pulses.   Pulses:      Radial pulses are 2+ on the right side, and 2+ on the left side.       Dorsalis pedis pulses are 2+ on the right side, and 2+ on the left side.       Posterior tibial pulses are 2+ on the right side, and 2+ on the left side.  Pulmonary/Chest: Effort normal and breath sounds normal. No accessory muscle usage. No respiratory distress. No decreased breath sounds. No wheezes. No rhonchi. No rales. Exhibits no tenderness and no bony tenderness.  No flail segment, crepitus or deformity Equal chest expansion  Abdominal: Soft. Normal appearance and bowel sounds are normal. There is no tenderness. There is no rigidity, no guarding and no CVA tenderness.  Abd soft and nontender  Musculoskeletal: Normal range of motion.       Thoracic back: Exhibits normal range of motion.       Lumbar back: Exhibits normal range of motion.  Full range of motion of the T-spine and L-spine No tenderness to palpation of the spinous processes of the T-spine or L-spine No crepitus, deformity or step-offs Mild tenderness to palpation of the paraspinous muscles of the L-spine  Lymphadenopathy:    Pt has no cervical adenopathy.  Neurological: Pt is alert and oriented to person, place, and time. Normal reflexes. No cranial nerve deficit. GCS eye subscore is 4. GCS verbal subscore is 5. GCS motor subscore is 6.  Reflex Scores:      Bicep reflexes are 2+ on the right side and 2+ on the left side.      Brachioradialis reflexes are 2+ on the right side and 2+ on the left side.      Patellar reflexes are 2+ on the right side and 2+ on the left side.      Achilles  reflexes are 2+ on the right side and 2+ on the left side. Speech is clear and goal oriented, follows commands Normal 5/5 strength in upper and lower extremities bilaterally including dorsiflexion and plantar flexion, strong and equal grip strength Sensation normal to light and sharp touch Moves extremities without ataxia, coordination intact Gait and Romberg deferred at this time due to pain will reassess. No Clonus  Skin: Skin is warm and dry. No rash noted. Pt is not diaphoretic. No erythema.  Psychiatric: Normal mood and affect.  Nursing note and vitals reviewed.     ED Treatments / Results  Labs (all labs ordered are listed, but only abnormal results are displayed) Labs Reviewed  I-STAT BETA HCG BLOOD, ED ([facility],  [facility], [facility] ONLY)    EKG None  Radiology Dg Lumbar Spine Complete  Result Date: 05/29/2017 CLINICAL DATA:  Left hip pain and right femur pain post falling down steps. Soreness in the lower back. EXAM: LUMBAR SPINE - COMPLETE 4+ VIEW COMPARISON:  None. FINDINGS: There is no evidence of lumbar spine fracture. Mild dextroconvex scoliosis. Probable bilateral pars articularis defects at L5-S1 with subsequent 10 mm anterolisthesis of L5 on S1 and moderate in severity posterior facet arthropathy. Prior left acetabular and pelvic malleable plate and screw fixation. IMPRESSION: No evidence of acute fracture of the lumbosacral spine. Probable L5-S1 pars articularis defects with subsequent 10 mm anterolisthesis of L5 on S1 and moderate posterior facet arthropathy. Electronically Signed   By: Ted Mcalpine M.D.   On: 05/29/2017 10:17   Dg Tibia/fibula Right  Result Date: 05/29/2017 CLINICAL DATA:  Status post fall with right leg pain. EXAM: RIGHT TIBIA AND FIBULA - 2 VIEW COMPARISON:  None. FINDINGS: There is no evidence of fracture or other focal bone lesions. Soft tissues are unremarkable. IMPRESSION: Negative. Electronically Signed   By: Ted Mcalpine M.D.   On: 05/29/2017 10:19    Ct Head Wo Contrast  Result Date: 05/29/2017 CLINICAL DATA:  Status post falling down 12 steps. EXAM: CT HEAD WITHOUT CONTRAST CT CERVICAL SPINE WITHOUT CONTRAST TECHNIQUE: Multidetector CT imaging of the head and cervical spine was performed following the standard protocol without intravenous contrast. Multiplanar CT image reconstructions of the cervical spine were also generated. COMPARISON:  None. FINDINGS: CT HEAD FINDINGS Brain: No evidence of acute infarction, hemorrhage, hydrocephalus, extra-axial collection or mass lesion/mass effect. Vascular: No hyperdense vessel or unexpected calcification. Skull: Normal. Negative for fracture or focal lesion. Sinuses/Orbits: No acute finding. Other: Left parietal scalp hematoma. CT CERVICAL SPINE FINDINGS Alignment: Reversal of cervical lordosis. Skull base and vertebrae: No acute fracture. No primary bone lesion or focal pathologic process. Soft tissues and spinal canal: No prevertebral fluid or swelling. No visible canal hematoma. Disc levels:  Normal. Upper chest: Negative. Other: Note. IMPRESSION: No acute intracranial abnormality. No evidence of acute traumatic injury to the cervical spine. Left parietal scalp hematoma. Electronically Signed   By: Ted Mcalpine M.D.   On: 05/29/2017 09:56   Ct Cervical Spine Wo Contrast  Result Date: 05/29/2017 CLINICAL DATA:  Status post falling down 12 steps. EXAM: CT HEAD WITHOUT CONTRAST CT CERVICAL SPINE WITHOUT CONTRAST TECHNIQUE: Multidetector CT imaging of the head and cervical spine was performed following the standard protocol without intravenous contrast. Multiplanar CT image reconstructions of the cervical spine were also generated. COMPARISON:  None. FINDINGS: CT HEAD FINDINGS Brain: No evidence of acute infarction, hemorrhage, hydrocephalus, extra-axial collection or mass lesion/mass effect. Vascular: No hyperdense vessel or unexpected calcification. Skull: Normal. Negative for fracture or focal lesion.  Sinuses/Orbits: No acute finding. Other: Left parietal scalp hematoma. CT CERVICAL SPINE FINDINGS Alignment: Reversal of cervical lordosis. Skull base and vertebrae: No acute fracture. No primary bone lesion or focal pathologic process. Soft tissues and spinal canal: No prevertebral fluid or swelling. No visible canal hematoma. Disc levels:  Normal. Upper chest: Negative. Other: Note. IMPRESSION: No acute intracranial abnormality. No evidence of acute traumatic injury to the cervical spine. Left parietal scalp hematoma. Electronically Signed   By: Ted Mcalpine M.D.   On: 05/29/2017 09:56   Ct Hip Left Wo Contrast  Result Date: 05/29/2017 CLINICAL DATA:  Left hip pain since a fall down stairs today. Initial encounter. EXAM: CT OF THE LEFT HIP WITHOUT  CONTRAST TECHNIQUE: Multidetector CT imaging of the left hip was performed according to the standard protocol. Multiplanar CT image reconstructions were also generated. COMPARISON:  Plain films of the hips earlier today. FINDINGS: Bones/Joint/Cartilage No acute bony or joint abnormality is identified. The patient is status post fixation of a complex left acetabular fracture which extends into the anterior and posterior columns. The fracture is healed. Healed left inferior pubic ramus and left parasymphyseal pubic bone fractures are also identified. The left hip is located. There is advanced left hip osteoarthritis with bone-on-bone joint space narrowing and bulky osteophytosis present about the joint. Heterotopic ossification along the superior margin of the head neck junction of the left hip measures 2 cm transverse by 4.3 cm AP by 1.1 cm craniocaudal. No lytic or sclerotic bone lesion is identified. The patient has chronic bilateral L5 pars interarticularis defects with associated 0.5 cm anterolisthesis L5 on S1. There is a broad-based disc bulge at this level but the central canal appears open. The foramina appear severely narrowed due to disc and  anterolisthesis. Ligaments Suboptimally assessed by CT. Muscles and Tendons Appear normal. Soft tissues Imaged intrapelvic contents demonstrate no acute abnormality. IMPRESSION: No acute abnormality. Remote healed left pelvic fractures as described above. Severe left hip osteoarthritis is markedly advanced for age. Chronic bilateral L5 pars interarticularis defects with associated 0.5 cm anterolisthesis L5 on S1. Bulging disc and anterolisthesis cause marked bilateral foraminal narrowing at L5-S1 Electronically Signed   By: Drusilla Kanner M.D.   On: 05/29/2017 14:42   Dg Femur Min 2 Views Left  Result Date: 05/29/2017 CLINICAL DATA:  Status post fall with left hip pain. EXAM: LEFT FEMUR 2 VIEWS COMPARISON:  None. FINDINGS: There is no evidence of acute fracture. Extensive posttraumatic changes in the left hemipelvis, left acetabulum and left femoral head with exuberant heterotopic calcification, and malleable plate and screw fixation of the left acetabulum and left pelvis. The left femoral head cysts somewhat more laterally within the acetabulum. This may represent a chronic posttraumatic finding, however acute dislocation cannot be entirely excluded. IMPRESSION: No definite evidence of acute fracture. Extensive posttraumatic changes in the left hemipelvis, left acetabulum and femoral head, with somewhat more lateral positioning of the left femoral neck within the acetabulum. This may be a chronic posttraumatic finding, however acute dislocation is difficult to exclude. Electronically Signed   By: Ted Mcalpine M.D.   On: 05/29/2017 10:27   Dg Femur Min 2 Views Right  Result Date: 05/29/2017 CLINICAL DATA:  Status post fall with right hip pain. EXAM: RIGHT FEMUR 2 VIEWS COMPARISON:  None. FINDINGS: There is no evidence of fracture or other focal bone lesions. Soft tissues are unremarkable. IMPRESSION: Negative. Electronically Signed   By: Ted Mcalpine M.D.   On: 05/29/2017 10:19   Dg Hips  Bilat With Pelvis Min 5 Views  Result Date: 05/29/2017 CLINICAL DATA:  Status post fall with lower back soreness. EXAM: DG HIP (WITH OR WITHOUT PELVIS) 5+V BILAT COMPARISON:  None. FINDINGS: There is no evidence of hip fracture or dislocation. Posttraumatic changes of the left pelvis and left hip with exuberant heterotopic calcifications about the left pelvis, left acetabulum and left femoral head and neck. Prior volume will plate and screw fixation of the left pelvis. IMPRESSION: No acute fracture or dislocation identified about the pelvis. Posttraumatic changes from remote trauma in the left pelvis. Electronically Signed   By: Ted Mcalpine M.D.   On: 05/29/2017 10:18    Procedures Procedures (including critical care time)  Medications Ordered in ED Medications  sodium chloride 0.9 % bolus 500 mL (has no administration in time range)  morphine 4 MG/ML injection 4 mg (has no administration in time range)     Initial Impression / Assessment and Plan / ED Course  I have reviewed the triage vital signs and the nursing notes.  Pertinent labs & imaging results that were available during my care of the patient were reviewed by me and considered in my medical decision making (see chart for details).     Patient presents to the ED today for evaluation following a mechanical fall.  Patient states that she fell down steps and landed onto her left hip.  Patient has known surgical intervention of left hip from prior fracture.  Patient reports hitting her head but denies LOC.  She reports bilateral leg pain, low back pain, headache and neck pain.  Vital signs reassuring.  Patient is not hypotensive.  No tachycardia noted.  No focal neuro deficit on exam.  Patient has no lower extremity shortening or rotation.  Neurovascularly intact in all extremities.  No focal abdominal tenderness.  No bruising to the chest or abdomen.  She does have some midline lumbar tenderness but no thoracic midline  tenderness.  She has some mild cervical midline tenderness without any step-offs or deformities.  Patient does have normal strength of lower extremities however range of motion causes pain in the left hip.  Patient has no obvious deformity or ecchymosis.  Skin compartments are soft.  Imaging was obtained.  Patient has no acute fractures noted on x-rays.  She does have chronic lower back changes and chronic changes the left hip.  They do make mention that the cannot exclude left hip dislocation given the amount of chronic changes.  I did ambulate patient and she was able to stand and walk 3-4 steps but states that it hurts.  Talk with Dale [place] with orthopedics.  He recommends obtaining CT scan of left hip.  If this is unremarkable patient can follow-up with outpatient setting.  CT scan returned that showed no dislocation or acute fracture.  Patient does have chronic lower back changes without any severe canal stenosis or foraminal stenosis. This can be followed up with ortho.  Doubt cauda equina.  She has normal sensation in the groin with normal strength.  Denies any loss of bowel or bladder urinary retention.  No signs of thoracic or intra-abdominal trauma.  Ct scan of head and neck are reassuring.  Discussed with patient crutches with weightbearing as tolerated.  We will give anti-inflammatories and muscle relaxers.    Pt is hemodynamically stable, in NAD, & able to ambulate in the ED. Evaluation does not show pathology that would require ongoing emergent intervention or inpatient treatment. I explained the diagnosis to the patient. Pain has been managed & has no complaints prior to dc. Pt is comfortable with above plan and is stable for discharge at this time. All questions were answered prior to disposition. Strict return precautions for f/u to the ED were discussed. Encouraged follow up with PCP.  Final Clinical Impressions(s) / ED Diagnoses   Final diagnoses:  Fall, initial encounter   Left hip pain  Right leg pain  Acute bilateral low back pain with bilateral sciatica  Contusion of head, unspecified part of head, initial encounter    ED Discharge Orders        Ordered    meloxicam (MOBIC) 15 MG tablet  Daily     05/29/17 1503  cyclobenzaprine (FLEXERIL) 10 MG tablet  3 times daily PRN     05/29/17 1503       Wallace Keller 05/29/17 1534    Nira Conn, MD 05/29/17 2025

## 2019-01-13 ENCOUNTER — Encounter (HOSPITAL_COMMUNITY): Payer: Self-pay | Admitting: *Deleted

## 2019-01-13 ENCOUNTER — Emergency Department (HOSPITAL_COMMUNITY)
Admission: EM | Admit: 2019-01-13 | Discharge: 2019-01-14 | Disposition: A | Discharge status: Home / Self Care | Payer: Medicaid Other | Attending: Emergency Medicine | Admitting: Emergency Medicine

## 2019-01-13 ENCOUNTER — Other Ambulatory Visit: Payer: Self-pay

## 2019-01-13 DIAGNOSIS — Z20828 Contact with and (suspected) exposure to other viral communicable diseases: Secondary | ICD-10-CM | POA: Diagnosis not present

## 2019-01-13 DIAGNOSIS — Z3490 Encounter for supervision of normal pregnancy, unspecified, unspecified trimester: Secondary | ICD-10-CM

## 2019-01-13 DIAGNOSIS — O99330 Smoking (tobacco) complicating pregnancy, unspecified trimester: Secondary | ICD-10-CM | POA: Diagnosis not present

## 2019-01-13 DIAGNOSIS — Z79899 Other long term (current) drug therapy: Secondary | ICD-10-CM | POA: Insufficient documentation

## 2019-01-13 DIAGNOSIS — F17210 Nicotine dependence, cigarettes, uncomplicated: Secondary | ICD-10-CM | POA: Insufficient documentation

## 2019-01-13 DIAGNOSIS — Z8659 Personal history of other mental and behavioral disorders: Secondary | ICD-10-CM

## 2019-01-13 DIAGNOSIS — F251 Schizoaffective disorder, depressive type: Secondary | ICD-10-CM | POA: Insufficient documentation

## 2019-01-13 DIAGNOSIS — Z046 Encounter for general psychiatric examination, requested by authority: Secondary | ICD-10-CM | POA: Diagnosis present

## 2019-01-13 HISTORY — DX: Encounter for supervision of normal pregnancy, unspecified, unspecified trimester: Z34.90

## 2019-01-13 LAB — COMPREHENSIVE METABOLIC PANEL
ALT: 15 U/L (ref 0–44)
AST: 19 U/L (ref 15–41)
Albumin: 3.8 g/dL (ref 3.5–5.0)
Alkaline Phosphatase: 44 U/L (ref 38–126)
Anion gap: 12 (ref 5–15)
BUN: 16 mg/dL (ref 6–20)
CO2: 20 mmol/L — ABNORMAL LOW (ref 22–32)
Calcium: 9.8 mg/dL (ref 8.9–10.3)
Chloride: 103 mmol/L (ref 98–111)
Creatinine, Ser: 0.9 mg/dL (ref 0.44–1.00)
GFR calc Af Amer: >60 mL/min (ref >60)
GFR calc non Af Amer: >60 mL/min (ref >60)
Glucose, Bld: 114 mg/dL — ABNORMAL HIGH (ref 70–99)
Potassium: 3.6 mmol/L (ref 3.5–5.1)
Sodium: 135 mmol/L (ref 135–145)
Total Bilirubin: 0.2 mg/dL — ABNORMAL LOW (ref 0.3–1.2)
Total Protein: 6.7 g/dL (ref 6.5–8.1)

## 2019-01-13 LAB — I-STAT BETA HCG BLOOD, ED (MC, WL, AP ONLY): I-stat hCG, quantitative: >2000 m[IU]/mL — ABNORMAL HIGH (ref <5)

## 2019-01-13 LAB — CBC
HCT: 33.4 % — ABNORMAL LOW (ref 36.0–46.0)
Hemoglobin: 11.6 g/dL — ABNORMAL LOW (ref 12.0–15.0)
MCH: 31 pg (ref 26.0–34.0)
MCHC: 34.7 g/dL (ref 30.0–36.0)
MCV: 89.3 fL (ref 80.0–100.0)
Platelets: 188 10*3/uL (ref 150–400)
RBC: 3.74 MIL/uL — ABNORMAL LOW (ref 3.87–5.11)
RDW: 11.9 % (ref 11.5–15.5)
WBC: 11.3 10*3/uL — ABNORMAL HIGH (ref 4.0–10.5)
nRBC: 0 % (ref 0.0–0.2)

## 2019-01-13 LAB — RAPID URINE DRUG SCREEN, HOSP PERFORMED
Amphetamines: NOT DETECTED
Barbiturates: NOT DETECTED
Benzodiazepines: NOT DETECTED
Cocaine: NOT DETECTED
Opiates: NOT DETECTED
Tetrahydrocannabinol: POSITIVE — AB

## 2019-01-13 LAB — VALPROIC ACID LEVEL: Valproic Acid Lvl: <10 ug/mL — ABNORMAL LOW (ref 50.0–100.0)

## 2019-01-13 LAB — ETHANOL: Alcohol, Ethyl (B): <10 mg/dL (ref <10)

## 2019-01-13 LAB — SARS CORONAVIRUS 2 (TAT 6-24 HRS): SARS Coronavirus 2: NEGATIVE

## 2019-01-13 LAB — ACETAMINOPHEN LEVEL: Acetaminophen (Tylenol), Serum: <10 ug/mL — ABNORMAL LOW (ref 10–30)

## 2019-01-13 LAB — SALICYLATE LEVEL: Salicylate Lvl: <7 mg/dL (ref 2.8–30.0)

## 2019-01-13 NOTE — ED Notes (Signed)
Patient belonging placed in locker #1. Pt valuable envelope given to security.

## 2019-01-13 NOTE — ED Notes (Signed)
Pt ate lunch. Pt on phone at nurses' desk. Aware this is he 2nd and last phone call for the day.

## 2019-01-13 NOTE — ED Notes (Signed)
Staffing Office advised no Sitter available at this time. Pt being monitored by staff. [place] NP reviewed pt's meds - none restarted d/t pt pregnant.

## 2019-01-13 NOTE — ED Notes (Signed)
Sitter arrived. Pt lying on bed w/eyes closed. Respirations even, unlabored.

## 2019-01-13 NOTE — ED Notes (Signed)
Dr Leonette Monarch completed 1st Exam this am for IVC paperwork - Copy faxed to [facility] -  Copy sent to Medical Records - Original placed in folder for Magistrate - ALL 3 sets on clipboard.

## 2019-01-13 NOTE — ED Notes (Signed)
Pt on phone at nurses' desk. 

## 2019-01-13 NOTE — ED Notes (Addendum)
Pt noted to be talkative - repetitive - states she is glad her son is w/her sister. States she wants him to stay w/her until she can get better. Then states there is nothing wrong w/her so she should be able to leave. Pt insists she has been taking her psych meds. States her sister works where she is prescribed her meds. Pt asking for psych medications - advised pt Myrle Sheng, [facility] NP, reviewed meds and did not renew them d/t pregnancy. Voiced understanding. Pt continues to deny SI/HI. Pt voiced understanding of tx plan - Inpt and will have re-TTS in AM. Pt then laid down on bed after talking w/RN for approx 30 minutes.
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
## 2019-01-13 NOTE — ED Triage Notes (Signed)
Pt brought in by [employer], Pt was IVC by sister whom was concerned when she showed up to the pt's house and the pt was acting like she wasn't taking her medication. Pt states she has been compliant with her medications and has been seeing her counselor at [employer]. Pt denies SI/HI.

## 2019-01-13 NOTE — ED Notes (Addendum)
Pt sitting on bed - alert - eating breakfast. Pt noted to be wearing eyeglasses - noted to be upset - crying. States she was just told she is pregnant and needs to leave. Pt asked to use phone - advised she may make 2 phone calls/day from phone at nurses' desk. Pt states she does not want to go out of her room at this time. Pt voiced understanding of tx plan after much encouragement.

## 2019-01-13 NOTE — ED Notes (Signed)
BH confirmed to for reassessment later today

## 2019-01-13 NOTE — ED Notes (Signed)
Pt noted to be crying after she spoke w/her sister on phone - stating she does not know what her sister advised [facility] and does not feel she needs to be Inpt. Denies SI/HI. States she receives her med through [employer] where her sister works and last Abilify was last week. States she takes her po meds daily - last being yesterday.

## 2019-01-13 NOTE — ED Notes (Signed)
Pt presents to room in purple scrubs. Per previous RN states pt's belongings inventoried and pt was wanded by security. No sitter present.   Pt calm and cooperative on arrival. Denies HI/SI. Denies needs. Pt informed need for urine sample.

## 2019-01-13 NOTE — ED Notes (Signed)
Pt continues to be calm and cooperative. This Probation officer spoke to the pt for 30 minutes. Pt speech continues to be rapid and repetitive. Pt expressing concern about not having her medications.

## 2019-01-13 NOTE — ED Notes (Signed)
Breakfast ordered 

## 2019-01-14 NOTE — Discharge Instructions (Addendum)
It is important to follow-up at [facility], for ongoing care of your psychiatric disease, as soon as possible.  For now you need to stop taking the Depakote as it can damage a growing fetus.  It appears that the rest of your medicines you are taking are okay to continue however you should discuss all your medications with your obstetrician, when you see them.  Try to schedule appointment in the next week or so to see the obstetrician.

## 2019-01-14 NOTE — ED Notes (Signed)
Pt ambulated to restroom by herself. Pt continues to be calm and cooperative at this time.

## 2019-01-14 NOTE — ED Provider Notes (Signed)
At this time, the patient is alert, cooperative and insightful.  She states that when she was IVC she was "just in a frenzy."  She feels better now and feels like she can manage herself and take care of her son.  She understands that she is pregnant and will need to modify her medications, until she is no longer pregnant.  She states that her last period was about 6 weeks ago.  She plans on following up with an obstetrician which she currently has.  I have rescinded her IVC.  I have reviewed her medications and it appears that the only medication contraindicated in early pregnancy, which she is currently taking, is Depakote.  Patient is stable for discharge with outpatient management.  She is referred to [facility] for ongoing psychiatric care and recommended to see an obstetrician as soon as possible.   Daleen Bo, MD 01/14/19 1100

## 2019-01-14 NOTE — Progress Notes (Signed)
Patient ID: Brandy Sweeney, female   DOB: [birth year], 28 y.o.   MRN: [id number]   Reassessment   HPI: Brandy Sweeney is a 28 y.o. female who was brought to [facility] ED under IVC paperwork completed by her sister after her mother called her when she was contacted by pt's son after he was concerned by his mother's actions. Apparently, pt's son arrived home after spending part of the day with his neighbor and his mother (pt) was not acting like herself, which pt's mother states was not quite accurate. Pt's mother states her son arrived home and he had gotten ready for bed and fallen asleep and she went upstairs to go to bed; she states she woke up when she was upstairs and could hear her son, who was upset, and she couldn't find her glasses, so she couldn't see, so she was discombobulated, so she was unable to help him/see what was going on. Pt states her son called her mother (his grandmother), who then called her sister (who lives in town), who came over to the house. Pt states her sister kept asking her questions, which she states she was unable to answer b/c she was still confused and unable to see b/c she was still unable to see and unable to find her glasses. Pt states that her son was also still upset because he had woken up and he was upstairs and she was in her bedroom, which she states was upsetting to her. Pt states the police then arrived b/c her sister called them and that, after some time, the police helped her find her glasses, which were by the sink, and she states that, once she had them on, she was able to see and she was no longer confused and she was able to explain what was going on.  Psychiatric reassessment: This s a 28 y.o. female who was admitted to the ED under IVC for concerns as noted above. During this evaluation, she is alert and  orietned x4, calm and cooperative. Patient reports she was placed under IVC by her sister. She reports prior to going to the ED, she was at home with her 22 year old  son and begin to feel sleepy and experience headache which is not uncommon. Reports her son fell asleep downstairs so while she was sleeping, because she was not feeling well, she decided to nap. Reports she later heard her son screaming and crying and when she went to him, reports her son stated he could not find her and thoughts she was not in the home. She reports that she could not find her glasses and she is legally blind so without them, they are hard to see. Admits that she knocked over things in the home, that her 97 year old son knocked over the christmas tree so the house was a mess. Reports she called a friend to come ger her son to help him clam down and so that he could speak to his aunt/her sister. Reports her sister arrived believing that," I was in a mental spasms"and admits that she may have seemed confused and discombobulated because she did not have her glasses so therefore, she could not see time although she denies that her behaviors were necessary for an IVC. She reports she feels as though her sister took out an IVC because her house was messy and she does not think she can take care of herself or her son.  She admits to having psychiatric issues in the past and  states that she is receiving care through [facility]. Reports that she is taking medication as prescribed through [facility] and her last dose of Abilify injectable was 1-2 weeks ago. She denies SI, HI, or AVH. Reports she just learned that she was pregnancy while in the ED however, this does not affect her mental health. She denies other concerns at this time.  Disposition Based on my evaluation, patient does not appear as a danger to herself or others. She does not meet criteria for inpatient psychiatric admission, She is being psychiatrically cleared at this time with recommendations to continue to follow-up with [facility] for outpatient psychiatric services.    EDP, Dr. Eulis Foster updated on current disposition.

## 2019-01-14 NOTE — ED Notes (Signed)
ED Provider at bedside. 

## 2019-03-16 ENCOUNTER — Encounter (HOSPITAL_COMMUNITY): Payer: Self-pay | Admitting: *Deleted

## 2019-03-16 ENCOUNTER — Inpatient Hospital Stay (HOSPITAL_COMMUNITY)
Admission: AD | Admit: 2019-03-16 | Discharge: 2019-03-16 | Disposition: A | Discharge status: Home / Self Care | Payer: Medicaid Other | Attending: Family Medicine | Admitting: Family Medicine

## 2019-03-16 ENCOUNTER — Inpatient Hospital Stay (HOSPITAL_COMMUNITY): Payer: Medicaid Other

## 2019-03-16 ENCOUNTER — Inpatient Hospital Stay (HOSPITAL_BASED_OUTPATIENT_CLINIC_OR_DEPARTMENT_OTHER): Payer: Medicaid Other

## 2019-03-16 DIAGNOSIS — O99330 Smoking (tobacco) complicating pregnancy, unspecified trimester: Secondary | ICD-10-CM | POA: Insufficient documentation

## 2019-03-16 DIAGNOSIS — R102 Pelvic and perineal pain: Secondary | ICD-10-CM | POA: Insufficient documentation

## 2019-03-16 DIAGNOSIS — F4310 Post-traumatic stress disorder, unspecified: Secondary | ICD-10-CM | POA: Diagnosis not present

## 2019-03-16 DIAGNOSIS — F17210 Nicotine dependence, cigarettes, uncomplicated: Secondary | ICD-10-CM | POA: Insufficient documentation

## 2019-03-16 DIAGNOSIS — N898 Other specified noninflammatory disorders of vagina: Secondary | ICD-10-CM | POA: Diagnosis not present

## 2019-03-16 DIAGNOSIS — Z888 Allergy status to other drugs, medicaments and biological substances status: Secondary | ICD-10-CM | POA: Diagnosis not present

## 2019-03-16 DIAGNOSIS — O34219 Maternal care for unspecified type scar from previous cesarean delivery: Secondary | ICD-10-CM

## 2019-03-16 DIAGNOSIS — O0932 Supervision of pregnancy with insufficient antenatal care, second trimester: Secondary | ICD-10-CM

## 2019-03-16 DIAGNOSIS — O26892 Other specified pregnancy related conditions, second trimester: Secondary | ICD-10-CM

## 2019-03-16 DIAGNOSIS — Z3A00 Weeks of gestation of pregnancy not specified: Secondary | ICD-10-CM | POA: Insufficient documentation

## 2019-03-16 DIAGNOSIS — Z3A21 21 weeks gestation of pregnancy: Secondary | ICD-10-CM

## 2019-03-16 DIAGNOSIS — Z3A20 20 weeks gestation of pregnancy: Secondary | ICD-10-CM | POA: Diagnosis not present

## 2019-03-16 DIAGNOSIS — O99212 Obesity complicating pregnancy, second trimester: Secondary | ICD-10-CM

## 2019-03-16 DIAGNOSIS — O26899 Other specified pregnancy related conditions, unspecified trimester: Secondary | ICD-10-CM

## 2019-03-16 DIAGNOSIS — A599 Trichomoniasis, unspecified: Secondary | ICD-10-CM | POA: Insufficient documentation

## 2019-03-16 DIAGNOSIS — Z79899 Other long term (current) drug therapy: Secondary | ICD-10-CM | POA: Diagnosis not present

## 2019-03-16 DIAGNOSIS — F2089 Other schizophrenia: Secondary | ICD-10-CM | POA: Insufficient documentation

## 2019-03-16 LAB — CBC WITH DIFFERENTIAL/PLATELET
Abs Immature Granulocytes: 0.11 10*3/uL — ABNORMAL HIGH (ref 0.00–0.07)
Basophils Absolute: 0 10*3/uL (ref 0.0–0.1)
Basophils Relative: 0 %
Eosinophils Absolute: 0.4 10*3/uL (ref 0.0–0.5)
Eosinophils Relative: 5 %
HCT: 34.3 % — ABNORMAL LOW (ref 36.0–46.0)
Hemoglobin: 11.5 g/dL — ABNORMAL LOW (ref 12.0–15.0)
Immature Granulocytes: 1 %
Lymphocytes Relative: 23 %
Lymphs Abs: 2 10*3/uL (ref 0.7–4.0)
MCH: 32.1 pg (ref 26.0–34.0)
MCHC: 33.5 g/dL (ref 30.0–36.0)
MCV: 95.8 fL (ref 80.0–100.0)
Monocytes Absolute: 0.4 10*3/uL (ref 0.1–1.0)
Monocytes Relative: 5 %
Neutro Abs: 5.5 10*3/uL (ref 1.7–7.7)
Neutrophils Relative %: 66 %
Platelets: 153 10*3/uL (ref 150–400)
RBC: 3.58 MIL/uL — ABNORMAL LOW (ref 3.87–5.11)
RDW: 13 % (ref 11.5–15.5)
WBC: 8.5 10*3/uL (ref 4.0–10.5)
nRBC: 0 % (ref 0.0–0.2)

## 2019-03-16 LAB — COMPREHENSIVE METABOLIC PANEL
ALT: 12 U/L (ref 0–44)
AST: 13 U/L — ABNORMAL LOW (ref 15–41)
Albumin: 3.1 g/dL — ABNORMAL LOW (ref 3.5–5.0)
Alkaline Phosphatase: 38 U/L (ref 38–126)
Anion gap: 11 (ref 5–15)
BUN: 6 mg/dL (ref 6–20)
CO2: 20 mmol/L — ABNORMAL LOW (ref 22–32)
Calcium: 8.7 mg/dL — ABNORMAL LOW (ref 8.9–10.3)
Chloride: 106 mmol/L (ref 98–111)
Creatinine, Ser: 0.52 mg/dL (ref 0.44–1.00)
GFR calc Af Amer: >60 mL/min (ref >60)
GFR calc non Af Amer: >60 mL/min (ref >60)
Glucose, Bld: 83 mg/dL (ref 70–99)
Potassium: 3.8 mmol/L (ref 3.5–5.1)
Sodium: 137 mmol/L (ref 135–145)
Total Bilirubin: 0.6 mg/dL (ref 0.3–1.2)
Total Protein: 6.1 g/dL — ABNORMAL LOW (ref 6.5–8.1)

## 2019-03-16 LAB — URINALYSIS, ROUTINE W REFLEX MICROSCOPIC
Bilirubin Urine: NEGATIVE
Glucose, UA: NEGATIVE mg/dL
Hgb urine dipstick: NEGATIVE
Ketones, ur: NEGATIVE mg/dL
Nitrite: NEGATIVE
Protein, ur: 30 mg/dL — AB
Specific Gravity, Urine: 1.024 (ref 1.005–1.030)
pH: 6 (ref 5.0–8.0)

## 2019-03-16 LAB — WET PREP, GENITAL
Sperm: NONE SEEN
Yeast Wet Prep HPF POC: NONE SEEN

## 2019-03-16 LAB — HCG, QUANTITATIVE, PREGNANCY: hCG, Beta Chain, Quant, S: 10804 m[IU]/mL — ABNORMAL HIGH (ref <5)

## 2019-03-16 NOTE — Discharge Instructions (Signed)
Abdominal Pain During Pregnancy  Belly (abdominal) pain is common during pregnancy. There are many possible causes. Most of the time, it is not a serious problem. Other times, it can be a sign that something is wrong with the pregnancy. Always tell your doctor if you have belly pain. Follow these instructions at home:  Do not have sex or put anything in your vagina until your pain goes away completely.  Get plenty of rest until your pain gets better.  Drink enough fluid to keep your pee (urine) pale yellow.  Take over-the-counter and prescription medicines only as told by your doctor.  Keep all follow-up visits as told by your doctor. This is important. Contact a doctor if:  Your pain continues or gets worse after resting.  You have lower belly pain that: ? Comes and goes at regular times. ? Spreads to your back. ? Feels like menstrual cramps.  You have pain or burning when you pee (urinate). Get help right away if:  You have a fever or chills.  You have vaginal bleeding.  You are leaking fluid from your vagina.  You are passing tissue from your vagina.  You throw up (vomit) for more than 24 hours.  You have watery poop (diarrhea) for more than 24 hours.  Your baby is moving less than usual.  You feel very weak or faint.  You have shortness of breath.  You have very bad pain in your upper belly. Summary  Belly (abdominal) pain is common during pregnancy. There are many possible causes.  If you have belly pain during pregnancy, tell your doctor right away.  Keep all follow-up visits as told by your doctor. This is important. This information is not intended to replace advice given to you by your health care provider. Make sure you discuss any questions you have with your health care provider. Document Revised: 04/30/2018 Document Reviewed: 04/14/2016 Elsevier Patient Education  2020 Elsevier Inc.  

## 2019-03-16 NOTE — MAU Provider Note (Signed)
History     CSN: [id number]  Arrival date and time: 03/16/19 1253   First Provider Initiated Contact with Patient 03/16/19 1412      Chief Complaint  Patient presents with  . Pelvic Pain   HPI   Brandy Sweeney is a 29 y.o. female G2P1000 @ unknown gestation here with pain in her abdomen. States she thinks she knows here LMP but not sure. The pain started one week ago and then stopped. Last night the pain returned and worsened this morning. States she could not feel the babies heartbeat on her abdomen this morning when she touches it. The pain in her abdomen worsens when she coughs or when she takes a deep breath. No bleeding. States her vaginal discharge is clear, foamy and snot like.   OB History    Gravida  2   Para  1   Term  1   Preterm      AB      Living        SAB      TAB      Ectopic      Multiple      Live Births              Past Medical History:  Diagnosis Date  . Postpartum depression   . Pregnant   . PTSD (post-traumatic stress disorder)   . Schizo affective schizophrenia [facility](HCC)     Past Surgical History:  Procedure Laterality Date  . CESAREAN SECTION    . HIP SURGERY      No family history on file.  Social History   Tobacco Use  . Smoking status: Current Every Day Smoker    Types: Cigarettes  Substance Use Topics  . Alcohol use: No  . Drug use: Yes    Types: Marijuana    Allergies:  Allergies  Allergen Reactions  . Trazodone And Nefazodone Other (See Comments)    Psychosis     Medications Prior to Admission  Medication Sig Dispense Refill Last Dose  . ARIPiprazole (ABILIFY IM) Inject 1 application into the muscle every 30 (thirty) days.     . ARIPiprazole (ABILIFY) 10 MG tablet Take 10 mg by mouth daily.     . benztropine (COGENTIN) 1 MG tablet Take 1 tablet (1 mg total) by mouth daily. (Patient not taking: Reported on 01/13/2019) 30 tablet 0   . benztropine (COGENTIN) 1 MG tablet Take 1 tablet (1 mg total) by mouth  at bedtime. (Patient not taking: Reported on 01/13/2019) 30 tablet 0   . buPROPion (WELLBUTRIN XL) 300 MG 24 hr tablet Take 300 mg by mouth daily.     . Cholecalciferol (VITAMIN D) 2000 units CAPS Take 200 Units by mouth daily.     . cyclobenzaprine (FLEXERIL) 10 MG tablet Take 1 tablet (10 mg total) by mouth 3 (three) times daily as needed for muscle spasms. (Patient not taking: Reported on 01/13/2019) 10 tablet 0   . FLUoxetine (PROZAC) 10 MG tablet Take 10 mg by mouth daily.     . meloxicam (MOBIC) 15 MG tablet Take 1 tablet (15 mg total) by mouth daily. (Patient not taking: Reported on 01/13/2019) 30 tablet 1   . omega-3 acid ethyl esters (LOVAZA) 1 g capsule Take 1 g by mouth 2 (two) times daily.     . Paliperidone Palmitate ER (INVEGA TRINZA) 819 MG/2.625ML SUSY Inject 1 each into the muscle every 3 (three) months.      . risperiDONE (RISPERDAL) 2  MG tablet Take one and half tablets (3 mg) in the morning and two tablets (4 mg) at bedtime. (Patient not taking: Reported on 01/13/2019) 105 tablet 0   . risperiDONE microspheres (RISPERDAL CONSTA) 25 MG injection Inject 3 mLs (37.5 mg total) into the muscle every 14 (fourteen) days. Next due on 01/08/14 for continuation of treatment (Patient not taking: Reported on 01/13/2019) 1 each 0   . zolpidem (AMBIEN) 5 MG tablet Take 1 tablet (5 mg total) by mouth at bedtime. (Patient not taking: Reported on 05/29/2017) 30 tablet 0    Results for orders placed or performed during the hospital encounter of 03/16/19 (from the past 48 hour(s))  Urinalysis, Routine w reflex microscopic     Status: Abnormal   Collection Time: 03/16/19  1:35 PM  Result Value Ref Range   Color, Urine YELLOW YELLOW   APPearance CLOUDY (A) CLEAR   Specific Gravity, Urine 1.024 1.005 - 1.030   pH 6.0 5.0 - 8.0   Glucose, UA NEGATIVE NEGATIVE mg/dL   Hgb urine dipstick NEGATIVE NEGATIVE   Bilirubin Urine NEGATIVE NEGATIVE   Ketones, ur NEGATIVE NEGATIVE mg/dL   Protein, ur 30 (A)  NEGATIVE mg/dL   Nitrite NEGATIVE NEGATIVE   Leukocytes,Ua LARGE (A) NEGATIVE   RBC / HPF 6-10 0 - 5 RBC/hpf   WBC, UA 11-20 0 - 5 WBC/hpf   Bacteria, UA FEW (A) NONE SEEN   Squamous Epithelial / LPF 21-50 0 - 5   Mucus PRESENT     Comment: Performed at [facility], [address] [place], [place] [address]  CBC with Differential/Platelet     Status: Abnormal   Collection Time: 03/16/19  2:21 PM  Result Value Ref Range   WBC 8.5 4.0 - 10.5 K/uL   RBC 3.58 (L) 3.87 - 5.11 MIL/uL   Hemoglobin 11.5 (L) 12.0 - 15.0 g/dL   HCT 41.7 (L) 40.8 - 14.4 %   MCV 95.8 80.0 - 100.0 fL   MCH 32.1 26.0 - 34.0 pg   MCHC 33.5 30.0 - 36.0 g/dL   RDW 81.8 56.3 - 14.9 %   Platelets 153 150 - 400 K/uL   nRBC 0.0 0.0 - 0.2 %   Neutrophils Relative % 66 %   Neutro Abs 5.5 1.7 - 7.7 K/uL   Lymphocytes Relative 23 %   Lymphs Abs 2.0 0.7 - 4.0 K/uL   Monocytes Relative 5 %   Monocytes Absolute 0.4 0.1 - 1.0 K/uL   Eosinophils Relative 5 %   Eosinophils Absolute 0.4 0.0 - 0.5 K/uL   Basophils Relative 0 %   Basophils Absolute 0.0 0.0 - 0.1 K/uL   Immature Granulocytes 1 %   Abs Immature Granulocytes 0.11 (H) 0.00 - 0.07 K/uL    Comment: Performed at [facility], [address] [place], [place] [address]  Comprehensive metabolic panel     Status: Abnormal   Collection Time: 03/16/19  2:21 PM  Result Value Ref Range   Sodium 137 135 - 145 mmol/L   Potassium 3.8 3.5 - 5.1 mmol/L   Chloride 106 98 - 111 mmol/L   CO2 20 (L) 22 - 32 mmol/L   Glucose, Bld 83 70 - 99 mg/dL   BUN 6 6 - 20 mg/dL   Creatinine, Ser 7.85 0.44 - 1.00 mg/dL   Calcium 8.7 (L) 8.9 - 10.3 mg/dL   Total Protein 6.1 (L) 6.5 - 8.1 g/dL   Albumin 3.1 (L) 3.5 - 5.0 g/dL  AST 13 (L) 15 - 41 U/L   ALT 12 0 - 44 U/L   Alkaline Phosphatase 38 38 - 126 U/L   Total Bilirubin 0.6 0.3 - 1.2 mg/dL   GFR calc non Af Amer >60 >60 mL/min   GFR calc Af Amer >60 >60 mL/min   Anion gap 11 5 - 15    Comment: Performed at [facility], [address], [place], [place] [address]  HIV Antibody (routine testing w rflx)     Status: Abnormal   Collection Time: 03/16/19  2:21 PM  Result Value Ref Range   HIV Screen 4th Generation wRfx Non Reactive (A) Non Reactive    Comment: (NOTE) Performed At: [facility] [address] [place], [place] [id number] Jolene Schimke MD PH:[phone]   hCG, quantitative, pregnancy     Status: Abnormal   Collection Time: 03/16/19  2:21 PM  Result Value Ref Range   hCG, Beta Chain, Quant, S 10,804 (H) <5 mIU/mL    Comment:          GEST. AGE      CONC.  (mIU/mL)   <=1 WEEK        5 - 50     2 WEEKS       50 - 500     3 WEEKS       100 - 10,000     4 WEEKS     1,000 - 30,000     5 WEEKS     3,500 - 115,000   6-8 WEEKS     12,000 - 270,000    12 WEEKS     15,000 - 220,000        FEMALE AND NON-PREGNANT FEMALE:     LESS THAN 5 mIU/mL Performed at [facility], [address], [place], [place] [address]   Wet prep, genital     Status: Abnormal   Collection Time: 03/16/19  4:01 PM   Specimen: Vaginal  Result Value Ref Range   Yeast Wet Prep HPF POC NONE SEEN NONE SEEN   Trich, Wet Prep PRESENT (A) NONE SEEN   Clue Cells Wet Prep HPF POC PRESENT (A) NONE SEEN   WBC, Wet Prep HPF POC MODERATE (A) NONE SEEN   Sperm NONE SEEN     Comment: Performed at [facility], [address] [place], [place] [address]  OB Urine Culture     Status: None (Preliminary result)   Collection Time: 03/16/19  4:30 PM   Specimen: OB Clean Catch; Urine  Result Value Ref Range   Specimen Description OB CLEAN CATCH    Special Requests NONE    Culture      CULTURE REINCUBATED FOR BETTER GROWTH Performed at [facility], [address], [place], [place] [address]    Report Status PENDING    [place] MFM OB LIMITED  Result Date: 03/17/2019 ----------------------------------------------------------------------  OBSTETRICS REPORT                       (Signed Final 03/17/2019  03:26 pm) ---------------------------------------------------------------------- Patient Info  ID #:       [id number]                          D.O.B.:  Jun 30, 1990 (29 yrs)  Name:       Brandy Sweeney  Visit Date: 03/16/2019 03:39 pm ---------------------------------------------------------------------- Performed By  Performed By:     Jeanene Erb BS,      Referred By:       [facility] MAU/Triage                    RDMS  Attending:        Sander Nephew      [facility]                    MD                                        [place] ---------------------------------------------------------------------- Orders   #  Description                          Code         Ordered By   1  [place] MFM OB LIMITED                    [phone]     Tiani Stanbery  ----------------------------------------------------------------------   #  Order #                    Accession #                 Episode #   1  [id number]                  [id number]                  [id number]  ---------------------------------------------------------------------- Indications   Pelvic pain affecting pregnancy in second      O26.892   trimester   Insufficient Prenatal Care                     Y18.56   Obesity complicating pregnancy, second         O99.212   trimester   History of cesarean delivery, currently        O35.219   pregnant   Weeks of gestation of pregnancy not            Z3A.00   specified  ---------------------------------------------------------------------- Vital Signs  Weight (lb): 237                               Height:        5'5"  BMI:         39.43 ---------------------------------------------------------------------- Fetal Evaluation  Num Of Fetuses:          1  Fetal Heart Rate(bpm):   145  Cardiac Activity:        Observed  Presentation:            Variable  Placenta:                Anterior  P. Cord Insertion:       Visualized  Amniotic Fluid  AFI FV:      Within normal limits                               Largest Pocket(cm)  3.9 ---------------------------------------------------------------------- Biometry  BPD:      49.6  mm     G. Age:  21w 0d                                                          FL/BPD:      65.9  %  FL:       32.7  mm     G. Age:  20w 1d ---------------------------------------------------------------------- OB History  Gravidity:    2         Term:   1        Prem:   0        SAB:   0  TOP:          0       Ectopic:  0        Living: 1 ---------------------------------------------------------------------- Gestational Age  U/S Today:     20w 4d                                        EDD:   07/30/19 ---------------------------------------------------------------------- Anatomy  Choroid Plexus:        Visualized             Bladder:                Visualized  Stomach:               Visualized ---------------------------------------------------------------------- Cervix Uterus Adnexa  Cervix  Length:            4.1  cm.  Normal appearance by transabdominal scan.  Left Ovary  Within normal limits.  Right Ovary  Within normal limits.  Adnexa  No abnormality visualized. ---------------------------------------------------------------------- Impression  Limited exam with no prenatal care this pregnancy  Maternal complaints of low pelvic pain  There is no evidence of placental abruption or previa ---------------------------------------------------------------------- Recommendations  Follow up dating ultrasound scehduled at first available. ----------------------------------------------------------------------               Lin Landsman, MD Electronically Signed Final Report   03/17/2019 03:26 pm ----------------------------------------------------------------------  Review of Systems  Constitutional: Negative for fever.  HENT: Positive for sore throat.   Respiratory: Positive for cough. Negative for shortness of breath.   Gastrointestinal: Positive for  abdominal pain.  Genitourinary: Negative for dysuria.   Physical Exam   Blood pressure 124/67, pulse 98, temperature 98.6 F (37 C), resp. rate 19, last menstrual period 10/25/2018, SpO2 99 %.  Physical Exam  Constitutional: She is oriented to person, place, and time. She appears well-developed and well-nourished. No distress.  HENT:  Head: Normocephalic.  Respiratory: Effort normal.  GI: Soft.  Gravid abdomen, unable to palpate fundal height.  Musculoskeletal:        General: Normal range of motion.  Neurological: She is alert and oriented to person, place, and time.  Skin: Skin is warm. She is not diaphoretic.  Psychiatric: Her behavior is normal.   MAU Course  Procedures  None  MDM  + fetal heart tones  Wet prep and GC collected UA [place] ordered   Assessment and Plan   A:  1. Vaginal discharge during pregnancy, antepartum   2. Abdominal pain  in pregnancy   3. Pelvic pain in pregnancy, antepartum, second trimester   4. Trichomonosis     P:  Discharge home in stable condition Rx: Flagyl 2 grams Partner needs treatment.  Return to MAU if symptoms worsen Start prenatal care.   Duane Lope, NP 03/17/2019 5:11 PM

## 2019-03-16 NOTE — MAU Note (Signed)
Brandy Sweeney is a 29 y.o. at Unknown here in MAU reporting:  That she is 4 months pregnant. Confirmed [facility] she states. Arrived via ems. Reports that while sitting on the toilet she a sharp pain that started in her pelvis on the left side and shot upward. Also endorses clear discharge.  Further reports that she usually can feel both pulses (hers and the baby) when she touches her stomach but now cannot. LMP: end of September; early October? Reports she was given a June EDD; but does not have an OBGYN Onset of complaint: today Pain score:  8/10 Vitals:   03/16/19 1257 03/16/19 1302  BP: 120/60   Pulse: 94   Resp: 19   Temp:  98.6 F (37 C)  SpO2: 99%      Lab orders placed from triage: ua

## 2019-03-17 ENCOUNTER — Other Ambulatory Visit: Payer: Self-pay | Admitting: Obstetrics and Gynecology

## 2019-03-17 LAB — HIV ANTIBODY (ROUTINE TESTING W REFLEX): HIV Screen 4th Generation wRfx: NONREACTIVE — AB

## 2019-03-17 MED ORDER — METRONIDAZOLE 500 MG PO TABS: 2000.0000 mg | ORAL_TABLET | Freq: Once | ORAL | 0 refills | Status: AC

## 2019-03-18 LAB — GC/CHLAMYDIA PROBE AMP (~~LOC~~) NOT AT ARMC
Chlamydia: NEGATIVE
Comment: NEGATIVE
Comment: NORMAL
Neisseria Gonorrhea: NEGATIVE

## 2019-03-18 LAB — CULTURE, OB URINE: Culture: 20000 — AB

## 2019-03-19 ENCOUNTER — Encounter: Payer: Self-pay | Admitting: Obstetrics and Gynecology

## 2019-03-19 ENCOUNTER — Other Ambulatory Visit: Payer: Self-pay | Admitting: Obstetrics and Gynecology

## 2019-03-19 DIAGNOSIS — R8271 Bacteriuria: Secondary | ICD-10-CM | POA: Insufficient documentation

## 2019-03-19 MED ORDER — AMOXICILLIN 875 MG PO TABS: 875.0000 mg | ORAL_TABLET | Freq: Two times a day (BID) | ORAL | 0 refills | Status: DC

## 2019-03-19 NOTE — Progress Notes (Signed)
+   gbs in urine. Rx: Amox 875 BID X 7 days.  Patient notified.  Venia Carbon I, NP 03/19/2019 2:22 PM

## 2019-05-14 ENCOUNTER — Other Ambulatory Visit: Payer: Self-pay

## 2019-05-14 ENCOUNTER — Inpatient Hospital Stay (HOSPITAL_COMMUNITY)
Admission: AD | Admit: 2019-05-14 | Discharge: 2019-05-14 | Disposition: A | Discharge status: Home / Self Care | Payer: Medicaid Other | Attending: Family Medicine | Admitting: Family Medicine

## 2019-05-14 ENCOUNTER — Encounter (HOSPITAL_COMMUNITY): Payer: Self-pay | Admitting: Family Medicine

## 2019-05-14 DIAGNOSIS — O26893 Other specified pregnancy related conditions, third trimester: Secondary | ICD-10-CM | POA: Insufficient documentation

## 2019-05-14 DIAGNOSIS — Z880 Allergy status to penicillin: Secondary | ICD-10-CM | POA: Insufficient documentation

## 2019-05-14 DIAGNOSIS — O99333 Smoking (tobacco) complicating pregnancy, third trimester: Secondary | ICD-10-CM | POA: Diagnosis not present

## 2019-05-14 DIAGNOSIS — Z3A28 28 weeks gestation of pregnancy: Secondary | ICD-10-CM | POA: Insufficient documentation

## 2019-05-14 DIAGNOSIS — N898 Other specified noninflammatory disorders of vagina: Secondary | ICD-10-CM | POA: Diagnosis not present

## 2019-05-14 DIAGNOSIS — R8271 Bacteriuria: Secondary | ICD-10-CM

## 2019-05-14 DIAGNOSIS — F17210 Nicotine dependence, cigarettes, uncomplicated: Secondary | ICD-10-CM | POA: Diagnosis not present

## 2019-05-14 DIAGNOSIS — Z3689 Encounter for other specified antenatal screening: Secondary | ICD-10-CM

## 2019-05-14 LAB — URINALYSIS, ROUTINE W REFLEX MICROSCOPIC
Bacteria, UA: NONE SEEN
Bilirubin Urine: NEGATIVE
Glucose, UA: NEGATIVE mg/dL
Hgb urine dipstick: NEGATIVE
Ketones, ur: NEGATIVE mg/dL
Leukocytes,Ua: NEGATIVE
Nitrite: NEGATIVE
Protein, ur: 100 mg/dL — AB
Specific Gravity, Urine: 1.03 (ref 1.005–1.030)
pH: 5 (ref 5.0–8.0)

## 2019-05-14 LAB — WET PREP, GENITAL
Clue Cells Wet Prep HPF POC: NONE SEEN
Sperm: NONE SEEN
Trich, Wet Prep: NONE SEEN
Yeast Wet Prep HPF POC: NONE SEEN

## 2019-05-14 LAB — POCT FERN TEST: POCT Fern Test: NEGATIVE

## 2019-05-14 NOTE — MAU Provider Note (Signed)
History     CSN: [id number]  Arrival date and time: 05/14/19 1033   First Provider Initiated Contact with Patient 05/14/19 1136      Chief Complaint  Patient presents with  . Rupture of Membranes   29 y.o. G2P1001 @28 .5 wks presenting with leaking fluid. Leaking started 2 days ago. Denies a gush of fluid but has leaked clear fluid multiple times mostly when she coughs. States fluid has ammonia smell but not like urine. Denies VB or ctx. Reports good FM. Has not started PN care yet, has appt next week.    OB History    Gravida  2   Para  1   Term  1   Preterm  0   AB  0   Living  1     SAB  0   TAB  0   Ectopic  0   Multiple  0   Live Births  0           Past Medical History:  Diagnosis Date  . Postpartum depression   . Pregnant   . PTSD (post-traumatic stress disorder)   . Schizo affective schizophrenia [facility])     Past Surgical History:  Procedure Laterality Date  . CESAREAN SECTION    . HIP SURGERY      No family history on file.  Social History   Tobacco Use  . Smoking status: Current Every Day Smoker    Types: Cigarettes  Substance Use Topics  . Alcohol use: No  . Drug use: Yes    Types: Marijuana    Allergies:  Allergies  Allergen Reactions  . Trazodone And Nefazodone Other (See Comments)    Psychosis   . Penicillins Rash    Medications Prior to Admission  Medication Sig Dispense Refill Last Dose  . Prenatal Vit-Fe Fumarate-FA (PRENATAL MULTIVITAMIN) TABS tablet Take 1 tablet by mouth daily at 12 noon.     . vitamin C (ASCORBIC ACID) 250 MG tablet Take 250 mg by mouth daily.     Marland Kitchen amoxicillin (AMOXIL) 875 MG tablet Take 1 tablet (875 mg total) by mouth 2 (two) times daily. 14 tablet 0   . buPROPion (WELLBUTRIN XL) 300 MG 24 hr tablet Take 300 mg by mouth daily.     . Cholecalciferol (VITAMIN D) 2000 units CAPS Take 200 Units by mouth daily.     . cyclobenzaprine (FLEXERIL) 10 MG tablet Take 1 tablet (10 mg total) by mouth 3  (three) times daily as needed for muscle spasms. (Patient not taking: Reported on 01/13/2019) 10 tablet 0   . FLUoxetine (PROZAC) 10 MG tablet Take 10 mg by mouth daily.     Marland Kitchen omega-3 acid ethyl esters (LOVAZA) 1 g capsule Take 1 g by mouth 2 (two) times daily.     . Paliperidone Palmitate ER (INVEGA TRINZA) 819 MG/2.625ML SUSY Inject 1 each into the muscle every 3 (three) months.      . zolpidem (AMBIEN) 5 MG tablet Take 1 tablet (5 mg total) by mouth at bedtime. (Patient not taking: Reported on 05/29/2017) 30 tablet 0     Review of Systems  Gastrointestinal: Negative for abdominal pain.  Genitourinary: Positive for vaginal discharge. Negative for vaginal bleeding.   Physical Exam   Blood pressure 132/79, pulse (!) 103, temperature 98.5 F (36.9 C), temperature source Oral, resp. rate 18, height 5\' 6"  (1.676 m), weight 120 kg, last menstrual period 10/25/2018, SpO2 99 %.  Physical Exam  Constitutional: She is oriented  to person, place, and time. She appears well-developed and well-nourished. No distress.  HENT:  Head: Normocephalic and atraumatic.  Cardiovascular: Normal rate.  Respiratory: Effort normal. No respiratory distress.  GI: Soft. She exhibits no distension. There is no abdominal tenderness.  gravid  Genitourinary:    Genitourinary Comments: SSE: no pool, fern neg SVE: closed/thick   Musculoskeletal:        General: Normal range of motion.     Cervical back: Normal range of motion.  Neurological: She is alert and oriented to person, place, and time.  Skin: Skin is warm and dry.  Psychiatric: She has a normal mood and affect.  EFM: 145 bpm, mod variability, + accels, no decels Toco: none  Results for orders placed or performed during the hospital encounter of 05/14/19 (from the past 24 hour(s))  Urinalysis, Routine w reflex microscopic     Status: Abnormal   Collection Time: 05/14/19 11:45 AM  Result Value Ref Range   Color, Urine AMBER (A) YELLOW   APPearance HAZY  (A) CLEAR   Specific Gravity, Urine 1.030 1.005 - 1.030   pH 5.0 5.0 - 8.0   Glucose, UA NEGATIVE NEGATIVE mg/dL   Hgb urine dipstick NEGATIVE NEGATIVE   Bilirubin Urine NEGATIVE NEGATIVE   Ketones, ur NEGATIVE NEGATIVE mg/dL   Protein, ur 381 (A) NEGATIVE mg/dL   Nitrite NEGATIVE NEGATIVE   Leukocytes,Ua NEGATIVE NEGATIVE   RBC / HPF 0-5 0 - 5 RBC/hpf   WBC, UA 0-5 0 - 5 WBC/hpf   Bacteria, UA NONE SEEN NONE SEEN   Squamous Epithelial / LPF 6-10 0 - 5   Mucus PRESENT   Wet prep, genital     Status: Abnormal   Collection Time: 05/14/19 11:45 AM   Specimen: Urine, Clean Catch  Result Value Ref Range   Yeast Wet Prep HPF POC NONE SEEN NONE SEEN   Trich, Wet Prep NONE SEEN NONE SEEN   Clue Cells Wet Prep HPF POC NONE SEEN NONE SEEN   WBC, Wet Prep HPF POC FEW (A) NONE SEEN   Sperm NONE SEEN   Fern Test     Status: None   Collection Time: 05/14/19 11:55 AM  Result Value Ref Range   POCT Fern Test Negative = intact amniotic membranes    MAU Course  Procedures  MDM Labs ordered and reviewed. No evidence of SROM or PTL. GC pending. Stable for discharge home.   Assessment and Plan  [redacted] weeks gestation Reactive NST Vaginal discharge in pregnancy Discharge home Follow up at [facility] as scheduled PTL precautions  Allergies as of 05/14/2019      Reactions   Trazodone And Nefazodone Other (See Comments)   Psychosis   Penicillins Rash      Medication List    STOP taking these medications   amoxicillin 875 MG tablet Commonly known as: AMOXIL   zolpidem 5 MG tablet Commonly known as: AMBIEN     TAKE these medications   buPROPion 300 MG 24 hr tablet Commonly known as: WELLBUTRIN XL Take 300 mg by mouth daily.   cyclobenzaprine 10 MG tablet Commonly known as: FLEXERIL Take 1 tablet (10 mg total) by mouth 3 (three) times daily as needed for muscle spasms.   FLUoxetine 10 MG tablet Commonly known as: PROZAC Take 10 mg by mouth daily.   Invega Trinza 819 MG/2.625ML  [place] Generic drug: Paliperidone Palmitate ER Inject 1 each into the muscle every 3 (three) months.   omega-3 acid ethyl esters 1 g  capsule Commonly known as: LOVAZA Take 1 g by mouth 2 (two) times daily.   prenatal multivitamin Tabs tablet Take 1 tablet by mouth daily at 12 noon.   vitamin C 250 MG tablet Commonly known as: ASCORBIC ACID Take 250 mg by mouth daily.   Vitamin D 50 MCG (2000 UT) Caps Take 200 Units by mouth daily.       Donette Larry, CNM 05/14/2019, 12:45 PM

## 2019-05-14 NOTE — Discharge Instructions (Signed)

## 2019-05-14 NOTE — MAU Note (Signed)
Felt some fluid come out yesterday, every time she coughed.  But then had some come out when she didn't cough. Is clear, doesn't smell like urine. No bleeding. no pain  Loose stools this morning.

## 2019-05-15 LAB — GC/CHLAMYDIA PROBE AMP (~~LOC~~) NOT AT ARMC
Chlamydia: NEGATIVE
Comment: NEGATIVE
Comment: NORMAL
Neisseria Gonorrhea: NEGATIVE

## 2019-05-17 ENCOUNTER — Telehealth: Payer: Self-pay | Admitting: Family Medicine

## 2019-05-17 NOTE — Telephone Encounter (Signed)
   Ma Munoz DOB: Dec 18, 1990 MRN: [id number]   RIDER WAIVER AND RELEASE OF LIABILITY  For purposes of improving physical access to our facilities, [facility] is pleased to partner with third parties to provide [facility] patients or other authorized individuals the option of convenient, on-demand ground transportation services (the Chiropractor") through use of the technology service that enables users to request on-demand ground transportation from independent third-party providers.  By opting to use and accept these [employer], I, the undersigned, hereby agree on behalf of myself, and on behalf of any minor child using the [employer] for whom I am the parent or legal guardian, as follows:  1. Science writer provided to me are provided by independent third-party transportation providers who are not [employer] or employees and who are unaffiliated with [employer]. 2. [facility] is neither a transportation carrier nor a common or public carrier. 3. [facility] has no control over the quality or safety of the transportation that occurs as a result of the [employer]. 4. [facility] cannot guarantee that any third-party transportation provider will complete any arranged transportation service. 5. [facility] makes no representation, warranty, or guarantee regarding the reliability, timeliness, quality, safety, suitability, or availability of any of the Transport Services or that they will be error free. 6. I fully understand that traveling by vehicle involves risks and dangers of serious bodily injury, including permanent disability, paralysis, and death. I agree, on behalf of myself and on behalf of any minor child using the Transport Services for whom I am the parent or legal guardian, that the entire risk arising out of my use of the [employer] remains solely with me, to the maximum extent permitted under applicable law. 7. The [employer]  are provided "as is" and "as available." [facility] disclaims all representations and warranties, express, implied or statutory, not expressly set out in these terms, including the implied warranties of merchantability and fitness for a particular purpose. 8. I hereby waive and release [facility], its agents, employees, officers, directors, representatives, insurers, attorneys, assigns, successors, subsidiaries, and affiliates from any and all past, present, or future claims, demands, liabilities, actions, causes of action, or suits of any kind directly or indirectly arising from acceptance and use of the [employer]. 9. I further waive and release [facility] [place] and its affiliates from all present and future liability and responsibility for any injury or death to persons or damages to property caused by or related to the use of the [employer]. 10. I have read this Waiver and Release of Liability, and I understand the terms used in it and their legal significance. This Waiver is freely and voluntarily given with the understanding that my right (as well as the right of any minor child for whom I am the parent or legal guardian using the [employer]) to legal recourse against [place] in connection with the [employer] is knowingly surrendered in return for use of these services.   I attest that I read the consent document to Braulio Conte, gave Ms. Callins the opportunity to ask questions and answered the questions asked (if any). I affirm that Braulio Conte then provided consent for she's participation in this program.     Launa Grill

## 2019-07-31 ENCOUNTER — Encounter (HOSPITAL_COMMUNITY): Payer: Self-pay | Admitting: Obstetrics & Gynecology

## 2019-07-31 ENCOUNTER — Inpatient Hospital Stay (HOSPITAL_COMMUNITY)
Admission: AD | Admit: 2019-07-31 | Discharge: 2019-08-03 | DRG: 788 | Disposition: A | Discharge status: Home / Self Care | Payer: Medicaid Other | Attending: Obstetrics and Gynecology | Admitting: Obstetrics and Gynecology

## 2019-07-31 ENCOUNTER — Other Ambulatory Visit: Payer: Self-pay

## 2019-07-31 DIAGNOSIS — O1414 Severe pre-eclampsia complicating childbirth: Principal | ICD-10-CM | POA: Diagnosis present

## 2019-07-31 DIAGNOSIS — O1410 Severe pre-eclampsia, unspecified trimester: Secondary | ICD-10-CM | POA: Diagnosis present

## 2019-07-31 DIAGNOSIS — O99824 Streptococcus B carrier state complicating childbirth: Secondary | ICD-10-CM | POA: Diagnosis present

## 2019-07-31 DIAGNOSIS — O368130 Decreased fetal movements, third trimester, not applicable or unspecified: Secondary | ICD-10-CM | POA: Diagnosis present

## 2019-07-31 DIAGNOSIS — O99334 Smoking (tobacco) complicating childbirth: Secondary | ICD-10-CM | POA: Diagnosis present

## 2019-07-31 DIAGNOSIS — F251 Schizoaffective disorder, depressive type: Secondary | ICD-10-CM | POA: Diagnosis present

## 2019-07-31 DIAGNOSIS — O34211 Maternal care for low transverse scar from previous cesarean delivery: Secondary | ICD-10-CM | POA: Diagnosis present

## 2019-07-31 DIAGNOSIS — F259 Schizoaffective disorder, unspecified: Secondary | ICD-10-CM | POA: Diagnosis present

## 2019-07-31 DIAGNOSIS — O0933 Supervision of pregnancy with insufficient antenatal care, third trimester: Secondary | ICD-10-CM

## 2019-07-31 DIAGNOSIS — Z20822 Contact with and (suspected) exposure to covid-19: Secondary | ICD-10-CM | POA: Diagnosis present

## 2019-07-31 DIAGNOSIS — O1413 Severe pre-eclampsia, third trimester: Secondary | ICD-10-CM

## 2019-07-31 DIAGNOSIS — F17210 Nicotine dependence, cigarettes, uncomplicated: Secondary | ICD-10-CM | POA: Diagnosis present

## 2019-07-31 DIAGNOSIS — F250 Schizoaffective disorder, bipolar type: Secondary | ICD-10-CM

## 2019-07-31 DIAGNOSIS — O99214 Obesity complicating childbirth: Secondary | ICD-10-CM | POA: Diagnosis present

## 2019-07-31 DIAGNOSIS — O163 Unspecified maternal hypertension, third trimester: Secondary | ICD-10-CM

## 2019-07-31 DIAGNOSIS — O99344 Other mental disorders complicating childbirth: Secondary | ICD-10-CM | POA: Diagnosis present

## 2019-07-31 DIAGNOSIS — Z3A39 39 weeks gestation of pregnancy: Secondary | ICD-10-CM | POA: Diagnosis not present

## 2019-07-31 DIAGNOSIS — R8271 Bacteriuria: Secondary | ICD-10-CM | POA: Diagnosis present

## 2019-07-31 DIAGNOSIS — O0930 Supervision of pregnancy with insufficient antenatal care, unspecified trimester: Secondary | ICD-10-CM

## 2019-07-31 DIAGNOSIS — Z98891 History of uterine scar from previous surgery: Secondary | ICD-10-CM

## 2019-07-31 LAB — CBC WITH DIFFERENTIAL/PLATELET
Abs Immature Granulocytes: 0.05 10*3/uL (ref 0.00–0.07)
Basophils Absolute: 0 10*3/uL (ref 0.0–0.1)
Basophils Relative: 0 %
Eosinophils Absolute: 0.4 10*3/uL (ref 0.0–0.5)
Eosinophils Relative: 4 %
HCT: 31.8 % — ABNORMAL LOW (ref 36.0–46.0)
Hemoglobin: 10.9 g/dL — ABNORMAL LOW (ref 12.0–15.0)
Immature Granulocytes: 1 %
Lymphocytes Relative: 26 %
Lymphs Abs: 2.5 10*3/uL (ref 0.7–4.0)
MCH: 31.8 pg (ref 26.0–34.0)
MCHC: 34.3 g/dL (ref 30.0–36.0)
MCV: 92.7 fL (ref 80.0–100.0)
Monocytes Absolute: 0.8 10*3/uL (ref 0.1–1.0)
Monocytes Relative: 8 %
Neutro Abs: 6 10*3/uL (ref 1.7–7.7)
Neutrophils Relative %: 61 %
Platelets: 138 10*3/uL — ABNORMAL LOW (ref 150–400)
RBC: 3.43 MIL/uL — ABNORMAL LOW (ref 3.87–5.11)
RDW: 13.4 % (ref 11.5–15.5)
WBC: 9.6 10*3/uL (ref 4.0–10.5)
nRBC: 0 % (ref 0.0–0.2)

## 2019-07-31 LAB — COMPREHENSIVE METABOLIC PANEL
ALT: 11 U/L (ref 0–44)
AST: 13 U/L — ABNORMAL LOW (ref 15–41)
Albumin: 2.6 g/dL — ABNORMAL LOW (ref 3.5–5.0)
Alkaline Phosphatase: 95 U/L (ref 38–126)
Anion gap: 8 (ref 5–15)
BUN: 7 mg/dL (ref 6–20)
CO2: 24 mmol/L (ref 22–32)
Calcium: 8.3 mg/dL — ABNORMAL LOW (ref 8.9–10.3)
Chloride: 106 mmol/L (ref 98–111)
Creatinine, Ser: 0.58 mg/dL (ref 0.44–1.00)
GFR calc Af Amer: >60 mL/min (ref >60)
GFR calc non Af Amer: >60 mL/min (ref >60)
Glucose, Bld: 96 mg/dL (ref 70–99)
Potassium: 3.2 mmol/L — ABNORMAL LOW (ref 3.5–5.1)
Sodium: 138 mmol/L (ref 135–145)
Total Bilirubin: 0.3 mg/dL (ref 0.3–1.2)
Total Protein: 5.7 g/dL — ABNORMAL LOW (ref 6.5–8.1)

## 2019-07-31 LAB — CBC
HCT: 32 % — ABNORMAL LOW (ref 36.0–46.0)
Hemoglobin: 10.9 g/dL — ABNORMAL LOW (ref 12.0–15.0)
MCH: 31.8 pg (ref 26.0–34.0)
MCHC: 34.1 g/dL (ref 30.0–36.0)
MCV: 93.3 fL (ref 80.0–100.0)
Platelets: 136 10*3/uL — ABNORMAL LOW (ref 150–400)
RBC: 3.43 MIL/uL — ABNORMAL LOW (ref 3.87–5.11)
RDW: 13.5 % (ref 11.5–15.5)
WBC: 9.9 10*3/uL (ref 4.0–10.5)
nRBC: 0 % (ref 0.0–0.2)

## 2019-07-31 LAB — SARS CORONAVIRUS 2 BY RT PCR (HOSPITAL ORDER, PERFORMED IN ~~LOC~~ HOSPITAL LAB): SARS Coronavirus 2: NEGATIVE

## 2019-07-31 LAB — HIV ANTIBODY (ROUTINE TESTING W REFLEX): HIV Screen 4th Generation wRfx: NONREACTIVE

## 2019-07-31 LAB — ABO/RH: ABO/RH(D): B POS

## 2019-07-31 LAB — TYPE AND SCREEN
ABO/RH(D): B POS
Antibody Screen: NEGATIVE

## 2019-07-31 LAB — HEPATITIS B SURFACE ANTIGEN: Hepatitis B Surface Ag: NONREACTIVE

## 2019-07-31 MED ORDER — MAGNESIUM SULFATE 40 GM/1000ML IV SOLN
INTRAVENOUS | Status: AC
  Administered 2019-07-31: 4 g via INTRAVENOUS
  Filled 2019-07-31: qty 1000

## 2019-07-31 MED ORDER — TERBUTALINE SULFATE 1 MG/ML IJ SOLN: 0.2500 mg | Freq: Once | INTRAMUSCULAR | Status: DC | PRN

## 2019-07-31 MED ORDER — SOD CITRATE-CITRIC ACID 500-334 MG/5ML PO SOLN
30.0000 mL | ORAL | Status: DC | PRN
  Administered 2019-08-01: 30 mL via ORAL
  Filled 2019-07-31: qty 30

## 2019-07-31 MED ORDER — ONDANSETRON HCL 4 MG/2ML IJ SOLN: 4.0000 mg | Freq: Four times a day (QID) | INTRAMUSCULAR | Status: DC | PRN

## 2019-07-31 MED ORDER — MAGNESIUM SULFATE BOLUS VIA INFUSION
4.0000 g | Freq: Once | INTRAVENOUS | Status: AC
  Filled 2019-07-31: qty 1000

## 2019-07-31 MED ORDER — LIDOCAINE HCL (PF) 1 % IJ SOLN: 30.0000 mL | INTRAMUSCULAR | Status: DC | PRN

## 2019-07-31 MED ORDER — CEFAZOLIN SODIUM-DEXTROSE 1-4 GM/50ML-% IV SOLN
1.0000 g | Freq: Three times a day (TID) | INTRAVENOUS | Status: DC
  Filled 2019-07-31: qty 50

## 2019-07-31 MED ORDER — OXYTOCIN-SODIUM CHLORIDE 30-0.9 UT/500ML-% IV SOLN: 2.5000 [IU]/h | INTRAVENOUS | Status: DC

## 2019-07-31 MED ORDER — FENTANYL CITRATE (PF) 100 MCG/2ML IJ SOLN
100.0000 ug | INTRAMUSCULAR | Status: DC | PRN
  Administered 2019-07-31 – 2019-08-01 (×2): 100 ug via INTRAVENOUS
  Filled 2019-07-31 (×2): qty 2

## 2019-07-31 MED ORDER — LACTATED RINGERS IV SOLN: 500.0000 mL | INTRAVENOUS | Status: DC | PRN

## 2019-07-31 MED ORDER — CEFAZOLIN SODIUM-DEXTROSE 2-4 GM/100ML-% IV SOLN: 2.0000 g | Freq: Once | INTRAVENOUS | Status: DC

## 2019-07-31 MED ORDER — OXYCODONE-ACETAMINOPHEN 5-325 MG PO TABS: 2.0000 | ORAL_TABLET | ORAL | Status: DC | PRN

## 2019-07-31 MED ORDER — LABETALOL HCL 5 MG/ML IV SOLN: 80.0000 mg | INTRAVENOUS | Status: DC | PRN

## 2019-07-31 MED ORDER — LACTATED RINGERS IV SOLN: INTRAVENOUS | Status: DC

## 2019-07-31 MED ORDER — HYDRALAZINE HCL 20 MG/ML IJ SOLN: 10.0000 mg | INTRAMUSCULAR | Status: DC | PRN

## 2019-07-31 MED ORDER — LABETALOL HCL 5 MG/ML IV SOLN
20.0000 mg | INTRAVENOUS | Status: DC | PRN
  Administered 2019-07-31: 20 mg via INTRAVENOUS
  Filled 2019-07-31: qty 4

## 2019-07-31 MED ORDER — OXYTOCIN-SODIUM CHLORIDE 30-0.9 UT/500ML-% IV SOLN
1.0000 m[IU]/min | INTRAVENOUS | Status: DC
  Administered 2019-07-31: 2 m[IU]/min via INTRAVENOUS
  Filled 2019-07-31: qty 500

## 2019-07-31 MED ORDER — MAGNESIUM SULFATE 40 GM/1000ML IV SOLN
2.0000 g/h | INTRAVENOUS | Status: DC
  Administered 2019-07-31: 2 g/h via INTRAVENOUS

## 2019-07-31 MED ORDER — OXYCODONE-ACETAMINOPHEN 5-325 MG PO TABS: 1.0000 | ORAL_TABLET | ORAL | Status: DC | PRN

## 2019-07-31 MED ORDER — FLEET ENEMA 7-19 GM/118ML RE ENEM: 1.0000 | ENEMA | RECTAL | Status: DC | PRN

## 2019-07-31 MED ORDER — LABETALOL HCL 5 MG/ML IV SOLN: 40.0000 mg | INTRAVENOUS | Status: DC | PRN

## 2019-07-31 MED ORDER — VANCOMYCIN HCL IN DEXTROSE 1-5 GM/200ML-% IV SOLN
1000.0000 mg | Freq: Two times a day (BID) | INTRAVENOUS | Status: DC
  Administered 2019-07-31: 1000 mg via INTRAVENOUS
  Filled 2019-07-31: qty 200

## 2019-07-31 MED ORDER — OXYTOCIN BOLUS FROM INFUSION: 333.0000 mL | Freq: Once | INTRAVENOUS | Status: DC

## 2019-07-31 MED ORDER — ACETAMINOPHEN 325 MG PO TABS: 650.0000 mg | ORAL_TABLET | ORAL | Status: DC | PRN

## 2019-07-31 NOTE — MAU Note (Signed)
Dr. Jerilynn Birkenhead at bedside.

## 2019-07-31 NOTE — Anesthesia Preprocedure Evaluation (Addendum)
Anesthesia Evaluation  Patient identified by MRN, date of birth, ID band Patient awake    Reviewed: Allergy & Precautions, NPO status , Patient's Chart, lab work & pertinent test results  History of Anesthesia Complications Negative for: history of anesthetic complications  Airway Mallampati: II  TM Distance: >3 FB Neck ROM: Full    Dental no notable dental hx. (+) Dental Advisory Given   Pulmonary Current Smoker,    Pulmonary exam normal        Cardiovascular hypertension, Normal cardiovascular exam     Neuro/Psych PSYCHIATRIC DISORDERS Anxiety Depression Bipolar Disorder Schizophrenia negative neurological ROS     GI/Hepatic negative GI ROS, Neg liver ROS,   Endo/Other  Morbid obesity  Renal/GU negative Renal ROS  negative genitourinary   Musculoskeletal negative musculoskeletal ROS (+)   Abdominal   Peds negative pediatric ROS (+)  Hematology negative hematology ROS (+)   Anesthesia Other Findings   Reproductive/Obstetrics (+) Pregnancy Pre-eclampsia                           Anesthesia Physical Anesthesia Plan  ASA: III and emergent  Anesthesia Plan: General   Post-op Pain Management:    Induction: Intravenous, Rapid sequence and Cricoid pressure planned  PONV Risk Score and Plan: 3 and Ondansetron, Scopolamine patch - Pre-op and Dexamethasone  Airway Management Planned: Natural Airway  Additional Equipment:   Intra-op Plan:   Post-operative Plan:   Informed Consent: I have reviewed the patients History and Physical, chart, labs and discussed the procedure including the risks, benefits and alternatives for the proposed anesthesia with the patient or authorized representative who has indicated his/her understanding and acceptance.     Dental advisory given  Plan Discussed with: Anesthesiologist, CRNA and Surgeon  Anesthesia Plan Comments: (Hx of difficulty  performing regional anesthesia at an outside hospital, required GA for C/S.)     Anesthesia Quick Evaluation

## 2019-07-31 NOTE — MAU Note (Signed)
.   Brandy Sweeney is a 29 y.o. at [redacted]w[redacted]d here in MAU reporting: Reports ctx that started yesterday and became more intense around 0800 this morning. She reports decreased fetal movement since yesterday. Patient denies LOF or VB. She states she has missed her last two appointments. GBS +.   Pain score: 9 Vitals:   07/31/19 1845  BP: (!) 150/94  Pulse: 98  Resp: 20  Temp: 98.3 F (36.8 C)  SpO2: 96%     FHT:138

## 2019-07-31 NOTE — MAU Note (Addendum)
Dr. Crissie Reese and Donette Larry at bedside. Pt turned right lateral initially at 1948. Nonrebreather O2 at 5L applied at 1950. Pt turned left lateral at 1950. LR bolus administered. Bedside U/S performed by Dr. Crissie Reese.

## 2019-07-31 NOTE — H&P (Addendum)
OBSTETRIC ADMISSION HISTORY AND PHYSICAL  Brandy Sweeney is a 29 y.o. female G2P1001 with IUP at [redacted]w[redacted]d by 20 wk [place] presenting for contractions and found to have severe Pre-Eclampsia. She does report decreased fetal movement since yesterday. Also reports a headache that she cannot get rid of and intermittently blurred vision for last several days. She denies No LOF, no VB, or peripheral edema, and RUQ pain.  She plans on giving the baby up for adoption. She requests Depo or POP's for birth control. She received no PNC.  Dating: By 20 wk [place] --->  Estimated Date of Delivery: 08/01/19  Sono: 03/17/2019    @[redacted]w[redacted]d , CWD, normal anatomy, variable presentation, anterior placental lie  Prenatal History/Complications: No PNC Hx C-section Severe Preeclampsia BMI 39 GBS bacteriuria Hx Schizoaffective disorder Hx Postpartum depression  Past Medical History: Past Medical History:  Diagnosis Date  . Postpartum depression   . Pregnant   . PTSD (post-traumatic stress disorder)   . Schizo affective schizophrenia [facility])     Past Surgical History: Past Surgical History:  Procedure Laterality Date  . CESAREAN SECTION    . HIP SURGERY      Obstetrical History: OB History    Gravida  2   Para  1   Term  1   Preterm  0   AB  0   Living  1     SAB  0   TAB  0   Ectopic  0   Multiple  0   Live Births  0           Social History: Social History   Socioeconomic History  . Marital status: Single    Spouse name: Not on file  . Number of children: Not on file  . Years of education: Not on file  . Highest education level: Not on file  Occupational History  . Not on file  Tobacco Use  . Smoking status: Current Some Day Smoker    Types: Cigarettes  . Smokeless tobacco: Never Used  Substance and Sexual Activity  . Alcohol use: No  . Drug use: Not Currently    Types: Marijuana  . Sexual activity: Not Currently    Birth control/protection: None  Other Topics Concern  . Not  on file  Social History Narrative  . Not on file   Social Determinants of Health   Financial Resource Strain:   . Difficulty of Paying Living Expenses:   Food Insecurity:   . Worried About [facility] in the Last Year:   . Programme researcher, broadcasting/film/video in the Last Year:   Transportation Needs:   . Barista (Medical):   Freight forwarder Lack of Transportation (Non-Medical):   Physical Activity:   . Days of Exercise per Week:   . Minutes of Exercise per Session:   Stress:   . Feeling of Stress :   Social Connections:   . Frequency of Communication with Friends and Family:   . Frequency of Social Gatherings with Friends and Family:   . Attends Religious Services:   . Active Member of Clubs or Organizations:   . Attends Marland Kitchen Meetings:   Banker Marital Status:     Family History: History reviewed. No pertinent family history.  Allergies: Allergies  Allergen Reactions  . Trazodone And Nefazodone Other (See Comments)    Psychosis   . Penicillins Rash    Medications Prior to Admission  Medication Sig Dispense Refill Last Dose  . buPROPion [facility]  XL) 300 MG 24 hr tablet Take 300 mg by mouth daily.     . Cholecalciferol (VITAMIN D) 2000 units CAPS Take 200 Units by mouth daily.     . cyclobenzaprine (FLEXERIL) 10 MG tablet Take 1 tablet (10 mg total) by mouth 3 (three) times daily as needed for muscle spasms. (Patient not taking: Reported on 01/13/2019) 10 tablet 0   . FLUoxetine (PROZAC) 10 MG tablet Take 10 mg by mouth daily.     Marland Kitchen omega-3 acid ethyl esters (LOVAZA) 1 g capsule Take 1 g by mouth 2 (two) times daily.     . Paliperidone Palmitate ER (INVEGA TRINZA) 819 MG/2.625ML SUSY Inject 1 each into the muscle every 3 (three) months.      . Prenatal Vit-Fe Fumarate-FA (PRENATAL MULTIVITAMIN) TABS tablet Take 1 tablet by mouth daily at 12 noon.     . vitamin C (ASCORBIC ACID) 250 MG tablet Take 250 mg by mouth daily.        Review of Systems   All systems  reviewed and negative except as stated in HPI  Blood pressure 137/74, pulse 83, temperature 98.4 F (36.9 C), temperature source Oral, resp. rate 19, height 5\' 7"  (1.702 m), weight 115.7 kg, last menstrual period 10/25/2018, SpO2 99 %. General appearance: alert, cooperative, appears stated age, no distress and moderately obese Lungs: normal effort Heart: regular rate  Abdomen: soft, non-tender; bowel sounds normal Pelvic: gravid uterus Extremities: Homans sign is negative, no sign of DVT Presentation: cephalic Fetal monitoringBaseline: 120 bpm, Variability: Good {> 6 bpm), Accelerations: Reactive and Decelerations: Early and one prolonged in MAU resolved with position changes Uterine activity: Frequency: Every 2-5 minutes Dilation: 2 Effacement (%): 90 Station: -2 Exam by:: [email or web address], CNM   Prenatal labs: ABO, Rh: --/--/B POS (07/07 1945) Antibody: NEG (07/07 1945) Rubella:   RPR:    HBsAg:    HIV: Non Reactive (02/20 1421)  GBS:   pos in urine earlier in pregnancy  2 hr Glucola none Genetic screening  none Anatomy 03-31-2002: only 20 wk limited US  Prenatal Transfer Tool  Maternal Diabetes: Unknown. No testing. Genetic Screening: no care Maternal Ultrasounds/Referrals: only limited 20 wk US Fetal Ultrasounds or other Referrals:  None Maternal Substance Abuse: Denies; UDS pending Significant Maternal Medications:  None Significant Maternal Lab Results: Group B Strep positive  Results for orders placed or performed during the hospital encounter of 07/31/19 (from the past 24 hour(s))  Type and screen [facility]   Collection Time: 07/31/19  7:45 PM  Result Value Ref Range   ABO/RH(D) B POS    Antibody Screen NEG    Sample Expiration      08/03/2019,2359 Performed at [facility], [address], [place], [place]   Comprehensive metabolic panel   Collection Time: 07/31/19  7:55 PM  Result Value Ref Range   Sodium 138 135 - 145 mmol/L    Potassium 3.2 (L) 3.5 - 5.1 mmol/L   Chloride 106 98 - 111 mmol/L   CO2 24 22 - 32 mmol/L   Glucose, Bld 96 70 - 99 mg/dL   BUN 7 6 - 20 mg/dL   Creatinine, Ser 10/01/19 0.44 - 1.00 mg/dL   Calcium 8.3 (L) 8.9 - 10.3 mg/dL   Total Protein 5.7 (L) 6.5 - 8.1 g/dL   Albumin 2.6 (L) 3.5 - 5.0 g/dL   AST 13 (L) 15 - 41 U/L   ALT 11 0 - 44 U/L  Alkaline Phosphatase 95 38 - 126 U/L   Total Bilirubin 0.3 0.3 - 1.2 mg/dL   GFR calc non Af Amer >60 >60 mL/min   GFR calc Af Amer >60 >60 mL/min   Anion gap 8 5 - 15  CBC   Collection Time: 07/31/19  7:55 PM  Result Value Ref Range   WBC 9.9 4.0 - 10.5 K/uL   RBC 3.43 (L) 3.87 - 5.11 MIL/uL   Hemoglobin 10.9 (L) 12.0 - 15.0 g/dL   HCT 16.1 (L) 36 - 46 %   MCV 93.3 80.0 - 100.0 fL   MCH 31.8 26.0 - 34.0 pg   MCHC 34.1 30.0 - 36.0 g/dL   RDW 09.6 04.5 - 40.9 %   Platelets 136 (L) 150 - 400 K/uL   nRBC 0.0 0.0 - 0.2 %    Patient Active Problem List   Diagnosis Date Noted  . Severe preeclampsia 07/31/2019  . History of cesarean section 07/31/2019  . GBS bacteriuria 03/19/2019  . Schizoaffective disorder, depressive type (HCC)   . Schizoaffective disorder, bipolar type (HCC)   . Postpartum depression   . Bipolar affective disorder, current episode hypomanic (HCC) 12/21/2013  . Schizoaffective disorder, unspecified type [facility])     Assessment/Plan:  Malin Sambrano is a 29 y.o. G2P1001 at [redacted]w[redacted]d here for early labor and found to also have Severe Pre-eclampsia.  #Labor: Vertex by BSUS and exam. Possibly making cervical change during time in MAU; will recheck and consider FB/Pitocin. AROM PRN. Discussed previous c-section in detail. Patient reports she labored some but had recently had hip surgery and wasn't dilating and very uncomfortable and ultimately opted for c-section. Denies complications or that she was told she could not have a vaginal birth in the future. TOLAC risks/benefits discussed and consent signed. Also reports that she had to go under  general anesthesia because they tried for an epidural or spinal multiple times and unsuccessful. Denies hx of spinal infection, scoliosis or other back complications. She is okay with anesthesia trialing an epidural. D/w Dr. Krista Blue as well and he is aware of patient and willing to try epidural. Will encourage early epidural in TOLAC patient. Request records from previous c-section. Anticipate VBAC. Addendum: Cervical recheck. Now 3/70/-3. Will expectantly manage. Pit PRN.  #Pain: Per patient request #FWB: Cat I currently; EFW: [id number] #ID:  GBS bacteriuria from MAU visit earlier in pregnancy. Reports hives and throat swelling with PCN. Clinda sensitivities unavailable. Will give Vanc. #MOF: Desires to give baby up for adoption; will consult SW #MOC: Depo vs POPs #Circ:  Undecided; baby up for adoption #Severe Pre-eclampsia: Presents with severe-range BP's, headache and intermittent blurred vision. Magnesium ordered with Labetalol protocol.  #No PNC: UDS collected. Denies drug use or alcohol use in this pregnancy. Prenatal panel collected. #Hx schizoaffective disorder: reports she was previously on Depakote but has not used this pregnancy; SW PP; consider Psych referral   Jerilynn Birkenhead, MD [facility] Family Medicine Fellow, [facility] for [facility], [facility] 07/31/2019, 8:52 PM

## 2019-07-31 NOTE — MAU Note (Signed)
Dr. Jerilynn Birkenhead at bedside discussing TOLAC with the patient. Consent form signed.

## 2019-07-31 NOTE — MAU Note (Signed)
CNM, [place] notified of two recent BP's of 158/12mmHg and 141/80mmHg. RN requested further direction of giving the patient the first dose of Labetalol and the provider instructed to go ahead and give her the first dose of 20 mg.

## 2019-08-01 ENCOUNTER — Inpatient Hospital Stay (HOSPITAL_COMMUNITY): Payer: Medicaid Other | Admitting: Anesthesiology

## 2019-08-01 ENCOUNTER — Encounter (HOSPITAL_COMMUNITY)
Admission: AD | Disposition: A | Discharge status: Home / Self Care | Payer: Self-pay | Source: Home / Self Care | Attending: Obstetrics and Gynecology

## 2019-08-01 ENCOUNTER — Encounter (HOSPITAL_COMMUNITY): Payer: Self-pay | Admitting: Obstetrics & Gynecology

## 2019-08-01 DIAGNOSIS — O99824 Streptococcus B carrier state complicating childbirth: Secondary | ICD-10-CM

## 2019-08-01 DIAGNOSIS — O1414 Severe pre-eclampsia complicating childbirth: Secondary | ICD-10-CM

## 2019-08-01 DIAGNOSIS — O34211 Maternal care for low transverse scar from previous cesarean delivery: Secondary | ICD-10-CM

## 2019-08-01 DIAGNOSIS — F259 Schizoaffective disorder, unspecified: Secondary | ICD-10-CM

## 2019-08-01 DIAGNOSIS — O0930 Supervision of pregnancy with insufficient antenatal care, unspecified trimester: Secondary | ICD-10-CM

## 2019-08-01 DIAGNOSIS — Z3A39 39 weeks gestation of pregnancy: Secondary | ICD-10-CM

## 2019-08-01 LAB — URINALYSIS, ROUTINE W REFLEX MICROSCOPIC
Bacteria, UA: NONE SEEN
Bilirubin Urine: NEGATIVE
Glucose, UA: NEGATIVE mg/dL
Ketones, ur: NEGATIVE mg/dL
Leukocytes,Ua: NEGATIVE
Nitrite: NEGATIVE
Protein, ur: 30 mg/dL — AB
Specific Gravity, Urine: 1.017 (ref 1.005–1.030)
pH: 7 (ref 5.0–8.0)

## 2019-08-01 LAB — CBC
HCT: 30.9 % — ABNORMAL LOW (ref 36.0–46.0)
Hemoglobin: 10.7 g/dL — ABNORMAL LOW (ref 12.0–15.0)
MCH: 32.4 pg (ref 26.0–34.0)
MCHC: 34.6 g/dL (ref 30.0–36.0)
MCV: 93.6 fL (ref 80.0–100.0)
Platelets: 110 10*3/uL — ABNORMAL LOW (ref 150–400)
RBC: 3.3 MIL/uL — ABNORMAL LOW (ref 3.87–5.11)
RDW: 13.3 % (ref 11.5–15.5)
WBC: 8.1 10*3/uL (ref 4.0–10.5)
nRBC: 0 % (ref 0.0–0.2)

## 2019-08-01 LAB — RAPID URINE DRUG SCREEN, HOSP PERFORMED
Amphetamines: NOT DETECTED
Barbiturates: NOT DETECTED
Benzodiazepines: NOT DETECTED
Cocaine: NOT DETECTED
Opiates: NOT DETECTED
Tetrahydrocannabinol: NOT DETECTED

## 2019-08-01 LAB — PROTEIN / CREATININE RATIO, URINE
Creatinine, Urine: 128.12 mg/dL
Protein Creatinine Ratio: 0.3 mg/mg{Cre} — ABNORMAL HIGH (ref 0.00–0.15)
Total Protein, Urine: 38 mg/dL

## 2019-08-01 LAB — RPR: RPR Ser Ql: NONREACTIVE

## 2019-08-01 SURGERY — Surgical Case
Anesthesia: General

## 2019-08-01 MED ORDER — POTASSIUM CHLORIDE CRYS ER 20 MEQ PO TBCR
40.0000 meq | EXTENDED_RELEASE_TABLET | Freq: Once | ORAL | Status: DC
  Filled 2019-08-01: qty 2

## 2019-08-01 MED ORDER — FENTANYL CITRATE (PF) 100 MCG/2ML IJ SOLN
INTRAMUSCULAR | Status: AC
  Filled 2019-08-01: qty 2

## 2019-08-01 MED ORDER — NALBUPHINE HCL 10 MG/ML IJ SOLN
INTRAMUSCULAR | Status: AC
  Filled 2019-08-01: qty 1

## 2019-08-01 MED ORDER — POVIDONE-IODINE 10 % EX SWAB: 2.0000 "application " | Freq: Once | CUTANEOUS | Status: DC

## 2019-08-01 MED ORDER — OXYCODONE HCL 5 MG PO TABS
5.0000 mg | ORAL_TABLET | ORAL | Status: DC | PRN
  Filled 2019-08-01: qty 1

## 2019-08-01 MED ORDER — FLUOXETINE HCL 20 MG PO TABS
10.0000 mg | ORAL_TABLET | Freq: Every day | ORAL | Status: DC
  Filled 2019-08-01: qty 1

## 2019-08-01 MED ORDER — TRANEXAMIC ACID-NACL 1000-0.7 MG/100ML-% IV SOLN
INTRAVENOUS | Status: DC | PRN
  Administered 2019-08-01: 1000 mg via INTRAVENOUS

## 2019-08-01 MED ORDER — NALBUPHINE HCL 10 MG/ML IJ SOLN
5.0000 mg | INTRAMUSCULAR | Status: DC | PRN
  Administered 2019-08-01: 5 mg via INTRAVENOUS

## 2019-08-01 MED ORDER — KETOROLAC TROMETHAMINE 30 MG/ML IJ SOLN
30.0000 mg | Freq: Four times a day (QID) | INTRAMUSCULAR | Status: AC
  Administered 2019-08-01 – 2019-08-02 (×4): 30 mg via INTRAVENOUS
  Filled 2019-08-01 (×4): qty 1

## 2019-08-01 MED ORDER — SODIUM CHLORIDE 0.9 % IV SOLN
INTRAVENOUS | Status: DC | PRN
Start: 2019-08-01 — End: 2019-08-01

## 2019-08-01 MED ORDER — COCONUT OIL OIL: 1.0000 "application " | TOPICAL_OIL | Status: DC | PRN

## 2019-08-01 MED ORDER — ONDANSETRON HCL 4 MG/2ML IJ SOLN
INTRAMUSCULAR | Status: DC | PRN
  Administered 2019-08-01: 4 mg via INTRAVENOUS

## 2019-08-01 MED ORDER — ONDANSETRON HCL 4 MG/2ML IJ SOLN: 4.0000 mg | Freq: Four times a day (QID) | INTRAMUSCULAR | Status: DC | PRN

## 2019-08-01 MED ORDER — PALIPERIDONE PALMITATE ER 156 MG/ML IM SUSY
156.0000 mg | PREFILLED_SYRINGE | Freq: Once | INTRAMUSCULAR | Status: DC
  Filled 2019-08-01: qty 1

## 2019-08-01 MED ORDER — OXYTOCIN-SODIUM CHLORIDE 30-0.9 UT/500ML-% IV SOLN: 2.5000 [IU]/h | INTRAVENOUS | Status: DC

## 2019-08-01 MED ORDER — FENTANYL CITRATE (PF) 100 MCG/2ML IJ SOLN
INTRAMUSCULAR | Status: DC | PRN
  Administered 2019-08-01: 250 ug via INTRAVENOUS
  Administered 2019-08-01: 100 ug via INTRAVENOUS

## 2019-08-01 MED ORDER — GLYCOPYRROLATE PF 0.2 MG/ML IJ SOSY
PREFILLED_SYRINGE | INTRAMUSCULAR | Status: AC
  Filled 2019-08-01: qty 1

## 2019-08-01 MED ORDER — LACTATED RINGERS AMNIOINFUSION: INTRAVENOUS | Status: DC

## 2019-08-01 MED ORDER — GLYCOPYRROLATE 0.2 MG/ML IJ SOLN
INTRAMUSCULAR | Status: DC | PRN
  Administered 2019-08-01: .1 mg via INTRAVENOUS

## 2019-08-01 MED ORDER — FENTANYL CITRATE (PF) 100 MCG/2ML IJ SOLN
25.0000 ug | INTRAMUSCULAR | Status: DC | PRN
  Administered 2019-08-01 (×4): 25 ug via INTRAVENOUS

## 2019-08-01 MED ORDER — DEXAMETHASONE SODIUM PHOSPHATE 4 MG/ML IJ SOLN
INTRAMUSCULAR | Status: DC | PRN
  Administered 2019-08-01: 4 mg via INTRAVENOUS

## 2019-08-01 MED ORDER — DIPHENHYDRAMINE HCL 50 MG/ML IJ SOLN: 12.5000 mg | INTRAMUSCULAR | Status: DC | PRN

## 2019-08-01 MED ORDER — TETANUS-DIPHTH-ACELL PERTUSSIS 5-2.5-18.5 LF-MCG/0.5 IM SUSP
0.5000 mL | Freq: Once | INTRAMUSCULAR | Status: AC
  Administered 2019-08-02: 0.5 mL via INTRAMUSCULAR
  Filled 2019-08-01: qty 0.5

## 2019-08-01 MED ORDER — FLUOXETINE HCL 10 MG PO CAPS
10.0000 mg | ORAL_CAPSULE | Freq: Every day | ORAL | Status: DC
  Administered 2019-08-01 – 2019-08-03 (×3): 10 mg via ORAL
  Filled 2019-08-01 (×4): qty 1

## 2019-08-01 MED ORDER — OXYTOCIN-SODIUM CHLORIDE 30-0.9 UT/500ML-% IV SOLN
INTRAVENOUS | Status: DC | PRN
  Administered 2019-08-01: 30 [IU] via INTRAVENOUS

## 2019-08-01 MED ORDER — IBUPROFEN 800 MG PO TABS
800.0000 mg | ORAL_TABLET | Freq: Four times a day (QID) | ORAL | Status: DC
  Administered 2019-08-02 – 2019-08-03 (×5): 800 mg via ORAL
  Filled 2019-08-01 (×6): qty 1

## 2019-08-01 MED ORDER — SCOPOLAMINE 1 MG/3DAYS TD PT72
MEDICATED_PATCH | TRANSDERMAL | Status: DC | PRN
  Administered 2019-08-01: 1 via TRANSDERMAL

## 2019-08-01 MED ORDER — SIMETHICONE 80 MG PO CHEW
80.0000 mg | CHEWABLE_TABLET | Freq: Three times a day (TID) | ORAL | Status: DC
  Administered 2019-08-01 – 2019-08-03 (×3): 80 mg via ORAL
  Filled 2019-08-01 (×5): qty 1

## 2019-08-01 MED ORDER — PHENYLEPHRINE HCL (PRESSORS) 10 MG/ML IV SOLN
INTRAVENOUS | Status: DC | PRN
Start: 2019-08-01 — End: 2019-08-01
  Administered 2019-08-01 (×4): 80 ug via INTRAVENOUS

## 2019-08-01 MED ORDER — ONDANSETRON HCL 4 MG/2ML IJ SOLN
INTRAMUSCULAR | Status: AC
  Filled 2019-08-01: qty 2

## 2019-08-01 MED ORDER — PRENATAL MULTIVITAMIN CH
1.0000 | ORAL_TABLET | Freq: Every day | ORAL | Status: DC
  Administered 2019-08-02 – 2019-08-03 (×2): 1 via ORAL
  Filled 2019-08-01 (×2): qty 1

## 2019-08-01 MED ORDER — ACETAMINOPHEN 325 MG PO TABS: 650.0000 mg | ORAL_TABLET | ORAL | Status: DC | PRN

## 2019-08-01 MED ORDER — LACTATED RINGERS IV SOLN: INTRAVENOUS | Status: DC

## 2019-08-01 MED ORDER — SIMETHICONE 80 MG PO CHEW
80.0000 mg | CHEWABLE_TABLET | ORAL | Status: DC
  Administered 2019-08-02 – 2019-08-03 (×2): 80 mg via ORAL
  Filled 2019-08-01 (×2): qty 1

## 2019-08-01 MED ORDER — SIMETHICONE 80 MG PO CHEW: 80.0000 mg | CHEWABLE_TABLET | ORAL | Status: DC | PRN

## 2019-08-01 MED ORDER — NALOXONE HCL 0.4 MG/ML IJ SOLN: 0.4000 mg | INTRAMUSCULAR | Status: DC | PRN

## 2019-08-01 MED ORDER — PHENYLEPHRINE 40 MCG/ML (10ML) SYRINGE FOR IV PUSH (FOR BLOOD PRESSURE SUPPORT): 80.0000 ug | PREFILLED_SYRINGE | INTRAVENOUS | Status: DC | PRN

## 2019-08-01 MED ORDER — SCOPOLAMINE 1 MG/3DAYS TD PT72: 1.0000 | MEDICATED_PATCH | Freq: Once | TRANSDERMAL | Status: DC

## 2019-08-01 MED ORDER — FENTANYL-BUPIVACAINE-NACL 0.5-0.125-0.9 MG/250ML-% EP SOLN
12.0000 mL/h | EPIDURAL | Status: DC | PRN
  Filled 2019-08-01: qty 250

## 2019-08-01 MED ORDER — DIBUCAINE (PERIANAL) 1 % EX OINT: 1.0000 "application " | TOPICAL_OINTMENT | CUTANEOUS | Status: DC | PRN

## 2019-08-01 MED ORDER — ENOXAPARIN SODIUM 60 MG/0.6ML ~~LOC~~ SOLN
60.0000 mg | SUBCUTANEOUS | Status: DC
  Administered 2019-08-02: 60 mg via SUBCUTANEOUS
  Filled 2019-08-01 (×2): qty 0.6

## 2019-08-01 MED ORDER — DIPHENHYDRAMINE HCL 25 MG PO CAPS: 25.0000 mg | ORAL_CAPSULE | Freq: Four times a day (QID) | ORAL | Status: DC | PRN

## 2019-08-01 MED ORDER — EPHEDRINE 5 MG/ML INJ: 10.0000 mg | INTRAVENOUS | Status: DC | PRN

## 2019-08-01 MED ORDER — SENNOSIDES-DOCUSATE SODIUM 8.6-50 MG PO TABS
2.0000 | ORAL_TABLET | ORAL | Status: DC
  Administered 2019-08-02 – 2019-08-03 (×2): 2 via ORAL
  Filled 2019-08-01 (×2): qty 2

## 2019-08-01 MED ORDER — PROPOFOL 10 MG/ML IV BOLUS
INTRAVENOUS | Status: DC | PRN
  Administered 2019-08-01: 150 mg via INTRAVENOUS

## 2019-08-01 MED ORDER — PROMETHAZINE HCL 25 MG/ML IJ SOLN: 6.2500 mg | INTRAMUSCULAR | Status: DC | PRN

## 2019-08-01 MED ORDER — MENTHOL 3 MG MT LOZG: 1.0000 | LOZENGE | OROMUCOSAL | Status: DC | PRN

## 2019-08-01 MED ORDER — SODIUM CHLORIDE 0.9 % IV SOLN
500.0000 mg | INTRAVENOUS | Status: AC
  Administered 2019-08-01: 500 mg via INTRAVENOUS

## 2019-08-01 MED ORDER — SODIUM CHLORIDE 0.9% FLUSH: 9.0000 mL | INTRAVENOUS | Status: DC | PRN

## 2019-08-01 MED ORDER — LACTATED RINGERS IV SOLN: 500.0000 mL | Freq: Once | INTRAVENOUS | Status: DC

## 2019-08-01 MED ORDER — CLINDAMYCIN PHOSPHATE 900 MG/50ML IV SOLN
INTRAVENOUS | Status: AC
  Filled 2019-08-01: qty 50

## 2019-08-01 MED ORDER — CLINDAMYCIN PHOSPHATE 900 MG/50ML IV SOLN
900.0000 mg | INTRAVENOUS | Status: AC
  Administered 2019-08-01: 900 mg via INTRAVENOUS

## 2019-08-01 MED ORDER — MIDAZOLAM HCL 5 MG/5ML IJ SOLN
INTRAMUSCULAR | Status: DC | PRN
Start: 2019-08-01 — End: 2019-08-01
  Administered 2019-08-01: 2 mg via INTRAVENOUS

## 2019-08-01 MED ORDER — WITCH HAZEL-GLYCERIN EX PADS: 1.0000 "application " | MEDICATED_PAD | CUTANEOUS | Status: DC | PRN

## 2019-08-01 MED ORDER — TRANEXAMIC ACID-NACL 1000-0.7 MG/100ML-% IV SOLN
INTRAVENOUS | Status: AC
  Filled 2019-08-01: qty 100

## 2019-08-01 MED ORDER — PALIPERIDONE ER 3 MG PO TB24
3.0000 mg | ORAL_TABLET | Freq: Every day | ORAL | Status: DC
  Administered 2019-08-01: 3 mg via ORAL
  Filled 2019-08-01: qty 1

## 2019-08-01 MED ORDER — BUPROPION HCL ER (XL) 300 MG PO TB24
300.0000 mg | ORAL_TABLET | Freq: Every day | ORAL | Status: DC
  Administered 2019-08-01 – 2019-08-03 (×3): 300 mg via ORAL
  Filled 2019-08-01 (×3): qty 1

## 2019-08-01 MED ORDER — MORPHINE SULFATE (PF) 0.5 MG/ML IJ SOLN
INTRAMUSCULAR | Status: AC
  Filled 2019-08-01: qty 10

## 2019-08-01 MED ORDER — GENTAMICIN SULFATE 40 MG/ML IJ SOLN
5.0000 mg/kg | INTRAVENOUS | Status: DC
  Filled 2019-08-01: qty 14.5

## 2019-08-01 MED ORDER — DIPHENHYDRAMINE HCL 50 MG/ML IJ SOLN: 12.5000 mg | Freq: Four times a day (QID) | INTRAMUSCULAR | Status: DC | PRN

## 2019-08-01 MED ORDER — MIDAZOLAM HCL 2 MG/2ML IJ SOLN
INTRAMUSCULAR | Status: AC
  Filled 2019-08-01: qty 2

## 2019-08-01 MED ORDER — NALBUPHINE HCL 10 MG/ML IJ SOLN: 5.0000 mg | INTRAMUSCULAR | Status: DC | PRN

## 2019-08-01 MED ORDER — SODIUM CHLORIDE 0.9 % IV SOLN
INTRAVENOUS | Status: AC
  Filled 2019-08-01: qty 500

## 2019-08-01 MED ORDER — ZOLPIDEM TARTRATE 5 MG PO TABS: 5.0000 mg | ORAL_TABLET | Freq: Every evening | ORAL | Status: DC | PRN

## 2019-08-01 MED ORDER — MAGNESIUM SULFATE 40 GM/1000ML IV SOLN
INTRAVENOUS | Status: DC | PRN
  Administered 2019-08-01: 2 g via INTRAVENOUS

## 2019-08-01 MED ORDER — DIVALPROEX SODIUM 125 MG PO DR TAB
125.0000 mg | DELAYED_RELEASE_TABLET | Freq: Two times a day (BID) | ORAL | Status: DC
  Administered 2019-08-01 – 2019-08-03 (×5): 125 mg via ORAL
  Filled 2019-08-01 (×6): qty 1

## 2019-08-01 MED ORDER — FENTANYL CITRATE (PF) 250 MCG/5ML IJ SOLN
INTRAMUSCULAR | Status: AC
  Filled 2019-08-01: qty 5

## 2019-08-01 MED ORDER — MEPERIDINE HCL 25 MG/ML IJ SOLN: 6.2500 mg | INTRAMUSCULAR | Status: DC | PRN

## 2019-08-01 MED ORDER — DEXAMETHASONE SODIUM PHOSPHATE 10 MG/ML IJ SOLN
INTRAMUSCULAR | Status: AC
  Filled 2019-08-01: qty 1

## 2019-08-01 MED ORDER — MAGNESIUM SULFATE 40 GM/1000ML IV SOLN
2.0000 g/h | INTRAVENOUS | Status: DC
  Administered 2019-08-01: 2 g/h via INTRAVENOUS
  Filled 2019-08-01: qty 1000

## 2019-08-01 MED ORDER — SENNOSIDES-DOCUSATE SODIUM 8.6-50 MG PO TABS: 2.0000 | ORAL_TABLET | Freq: Once | ORAL | Status: DC

## 2019-08-01 MED ORDER — PALIPERIDONE ER 3 MG PO TB24
3.0000 mg | ORAL_TABLET | Freq: Two times a day (BID) | ORAL | Status: DC
  Administered 2019-08-01 – 2019-08-03 (×4): 3 mg via ORAL
  Filled 2019-08-01 (×5): qty 1

## 2019-08-01 MED ORDER — OXYTOCIN-SODIUM CHLORIDE 30-0.9 UT/500ML-% IV SOLN
INTRAVENOUS | Status: AC
  Filled 2019-08-01: qty 500

## 2019-08-01 MED ORDER — SUCCINYLCHOLINE CHLORIDE 20 MG/ML IJ SOLN
INTRAMUSCULAR | Status: DC | PRN
  Administered 2019-08-01: 160 mg via INTRAVENOUS

## 2019-08-01 MED ORDER — MORPHINE SULFATE 2 MG/ML IV SOLN
INTRAVENOUS | Status: DC
  Administered 2019-08-01: 8 mg via INTRAVENOUS
  Filled 2019-08-01: qty 30
  Filled 2019-08-01: qty 25
  Filled 2019-08-01: qty 30

## 2019-08-01 SURGICAL SUPPLY — 32 items
BENZOIN TINCTURE PRP APPL 2/3 (GAUZE/BANDAGES/DRESSINGS) ×3 IMPLANT
CHLORAPREP W/TINT 26ML (MISCELLANEOUS) ×3 IMPLANT
CLAMP CORD UMBIL (MISCELLANEOUS) IMPLANT
CLOTH BEACON ORANGE TIMEOUT ST (SAFETY) ×3 IMPLANT
DRSG OPSITE POSTOP 4X10 (GAUZE/BANDAGES/DRESSINGS) ×3 IMPLANT
DRSG PAD ABDOMINAL 8X10 ST (GAUZE/BANDAGES/DRESSINGS) ×3 IMPLANT
ELECT REM PT RETURN 9FT ADLT (ELECTROSURGICAL) ×3
ELECTRODE REM PT RTRN 9FT ADLT (ELECTROSURGICAL) ×1 IMPLANT
EXTRACTOR VACUUM M CUP 4 TUBE (SUCTIONS) IMPLANT
EXTRACTOR VACUUM M CUP 4' TUBE (SUCTIONS)
GLOVE BIOGEL PI IND STRL 7.0 (GLOVE) ×3 IMPLANT
GLOVE BIOGEL PI INDICATOR 7.0 (GLOVE) ×6
GLOVE ECLIPSE 7.0 STRL STRAW (GLOVE) ×3 IMPLANT
GOWN STRL REUS W/TWL LRG LVL3 (GOWN DISPOSABLE) ×6 IMPLANT
KIT ABG SYR 3ML LUER SLIP (SYRINGE) IMPLANT
NEEDLE HYPO 22GX1.5 SAFETY (NEEDLE) ×3 IMPLANT
NEEDLE HYPO 25X5/8 SAFETYGLIDE (NEEDLE) ×3 IMPLANT
NS IRRIG 1000ML POUR BTL (IV SOLUTION) ×3 IMPLANT
PACK C SECTION WH (CUSTOM PROCEDURE TRAY) ×3 IMPLANT
PAD ABD 7.5X8 STRL (GAUZE/BANDAGES/DRESSINGS) ×3 IMPLANT
PAD OB MATERNITY 4.3X12.25 (PERSONAL CARE ITEMS) ×3 IMPLANT
PENCIL SMOKE EVAC W/HOLSTER (ELECTROSURGICAL) ×3 IMPLANT
RTRCTR C-SECT PINK 25CM LRG (MISCELLANEOUS) IMPLANT
SUT PDS AB 0 CTX 36 PDP370T (SUTURE) IMPLANT
SUT PLAIN 2 0 XLH (SUTURE) IMPLANT
SUT VIC AB 0 CTX 36 (SUTURE) ×4
SUT VIC AB 0 CTX36XBRD ANBCTRL (SUTURE) ×2 IMPLANT
SUT VIC AB 4-0 KS 27 (SUTURE) ×3 IMPLANT
SYR CONTROL 10ML LL (SYRINGE) ×3 IMPLANT
TOWEL OR 17X24 6PK STRL BLUE (TOWEL DISPOSABLE) ×3 IMPLANT
TRAY FOLEY W/BAG SLVR 14FR LF (SET/KITS/TRAYS/PACK) ×3 IMPLANT
WATER STERILE IRR 1000ML POUR (IV SOLUTION) ×3 IMPLANT

## 2019-08-01 NOTE — Discharge Summary (Addendum)
Postpartum Discharge Summary  Date of Service updated 08/03/2015     Patient Name: Brandy Sweeney DOB: May 10, 1990 MRN: [id number]  Date of admission: 07/31/2019 Delivery date:08/01/2019  Delivering provider: Chauncey Mann  Date of discharge: 08/03/2019  Admitting diagnosis: Gestational hypertension, third trimester [O13.3] Intrauterine pregnancy: [redacted]w[redacted]d    Secondary diagnosis:  Active Problems:   Schizoaffective disorder, unspecified type ([place]   Schizoaffective disorder, depressive type ([place]   GBS bacteriuria   Severe preeclampsia   History of cesarean section   No prenatal care in current pregnancy  Additional problems: None    Discharge diagnosis: Term Pregnancy Delivered and Preeclampsia (severe)                                              Post partum procedures:None Augmentation: AROM and Pitocin Complications: None  Hospital course: Onset of Labor With Unplanned C/S   29y.o. yo G2P2002 at 418w0das admitted in Latent Labor on 07/31/2019. Patient had a labor course significant for presenting in early labor and found to have severe Pre-E. She initially desired TOLAC and was on L&D for several hours and arrived to 4 cm and then declined further TOLAC due to discomfort.. The patient went for cesarean section due to Elective Repeat. Delivery details as follows: Membrane Rupture Time/Date: 5:15 AM ,08/01/2019   Delivery Method:C-Section, Low Transverse  Details of operation can be found in separate operative note. Patient had an uncomplicated postpartum course. Magnesium continued for 24 hours PP. BP's monitored and in the mild range. SW consulted and the baby is going home with her mother. She is ambulating,tolerating a regular diet, passing flatus, and urinating well.  Patient is discharged home in stable condition 08/03/19.  Newborn Data: Birth date:08/01/2019  Birth time:6:54 AM  Gender:Female  Living status:Living  Apgars:8 ,9  Weight:3435 g   Magnesium Sulfate received: Yes:  Seizure prophylaxis BMZ received: No Rhophylac:N/A MMR:No T-DaP:Given postpartum Flu: No Transfusion:No  Physical exam  Vitals:   08/02/19 2005 08/03/19 0020 08/03/19 0402 08/03/19 0743  BP:  126/70 136/84 (!) 141/78  Pulse:  (!) 106 92 (!) 108  Resp:  19 19 20   Temp:  97.8 F (36.6 C) 97.8 F (36.6 C) 98 F (36.7 C)  TempSrc:  Oral Oral Oral  SpO2: 93% 97% 94% 100%  Weight:      Height:       General: alert, cooperative and no distress Lochia: appropriate Uterine Fundus: firm Incision: Healing well with no significant drainage DVT Evaluation: No evidence of DVT seen on physical exam. Labs: Lab Results  Component Value Date   WBC 8.4 08/02/2019   HGB 10.6 (L) 08/02/2019   HCT 30.9 (L) 08/02/2019   MCV 94.2 08/02/2019   PLT 130 (L) 08/02/2019   CMP Latest Ref Rng & Units 07/31/2019  Glucose 70 - 99 mg/dL 96  BUN 6 - 20 mg/dL 7  Creatinine 0.44 - 1.00 mg/dL 0.58  Sodium 135 - 145 mmol/L 138  Potassium 3.5 - 5.1 mmol/L 3.2(L)  Chloride 98 - 111 mmol/L 106  CO2 22 - 32 mmol/L 24  Calcium 8.9 - 10.3 mg/dL 8.3(L)  Total Protein 6.5 - 8.1 g/dL 5.7(L)  Total Bilirubin 0.3 - 1.2 mg/dL 0.3  Alkaline Phos 38 - 126 U/L 95  AST 15 - 41 U/L 13(L)  ALT 0 - 44 U/L  11   Edinburgh Score: No flowsheet data found.   After visit meds:  Allergies as of 08/03/2019      Reactions   Trazodone And Nefazodone Hives   Also itching   Penicillins Rash      Medication List    STOP taking these medications   cyclobenzaprine 10 MG tablet Commonly known as: FLEXERIL     TAKE these medications   acetaminophen 500 MG tablet Commonly known as: TYLENOL Take 500 mg by mouth every 6 (six) hours as needed for mild pain.   divalproex 125 MG DR tablet Commonly known as: DEPAKOTE Take 1 tablet (125 mg total) by mouth 2 (two) times daily.   FLUoxetine 10 MG capsule Commonly known as: PROZAC Take 1 capsule (10 mg total) by mouth daily.   ibuprofen 800 MG tablet Commonly known as:  ADVIL Take 1 tablet (800 mg total) by mouth 3 (three) times daily with meals as needed for headache, mild pain, moderate pain or cramping.   NIFEdipine 30 MG 24 hr tablet Commonly known as: ADALAT CC Take 1 tablet (30 mg total) by mouth daily.   oxyCODONE 5 MG immediate release tablet Commonly known as: Oxy IR/ROXICODONE Take 1 tablet (5 mg total) by mouth every 6 (six) hours as needed for severe pain or breakthrough pain.   paliperidone 3 MG 24 hr tablet Commonly known as: INVEGA Take 1 tablet (3 mg total) by mouth daily.   senna-docusate 8.6-50 MG tablet Commonly known as: Senokot-S Take 2 tablets by mouth at bedtime as needed for mild constipation or moderate constipation.        Discharge home in stable condition Infant Feeding: NA; baby up for adoption Infant Disposition:adoption Discharge instruction: per After Visit Summary and Postpartum booklet. Activity: Advance as tolerated. Pelvic rest for 6 weeks.  Diet: routine diet Future Appointments:No future appointments. Follow up Visit:  [place]. Call in 1 day(s).   Specialty: Behavioral Health Why: New facility taking place of [facility]. Please call to make an appointment for ongoing psych services. There is a walkin facility if you need crisis stabilization.  Contact information: Milroy Brown for [place] at [facility] Follow up.   Specialty: Obstetrics and Gynecology Contact information: [place] [address] [phone]               Please schedule this patient for a In person postpartum visit in 4 weeks with the following provider: Any provider. Additional Postpartum F/U:Postpartum Depression checkup, Incision check 1 week and BP check 1 week  High risk pregnancy complicated by: HTN Delivery mode:  C-Section, Low Transverse   Anticipated Birth Control:  POPs and Depo   08/03/2019 Donnamae Jude, MD

## 2019-08-01 NOTE — Progress Notes (Signed)
Labor Progress Note Brandy Sweeney is a 29 y.o. G2P1001 at [redacted]w[redacted]d presented for early labor and admitted for severe Pre-E. S: Patient reports she is too uncomfortable and still only 4 cm. She does not think she would be able to push due to prior hip surgeries and elects for repeat c-section.  O:  BP 125/63   Pulse 81   Temp 98.3 F (36.8 C)   Resp 19   Ht 5\' 7"  (1.702 m)   Wt 115.7 kg   LMP 10/25/2018   SpO2 99%   BMI 39.95 kg/m  EFM: 120, moderate variability, pos accels, some early and variable decels, reactive TOCO: q2-52m  CVE: Dilation: 4 Effacement (%): 70 Cervical Position: Posterior Station: -2 Presentation: Vertex Exam by:: Dr. [email or web address]    A&P: 29 y.o. G2P1001 [redacted]w[redacted]d here for early labor and admitted for Severe Pre-E. #Labor: S/p Pitocin, AROM and IUPC/FSE placement. Patient now elects for repeat c-section. The risks of surgery were discussed with the patient including but were not limited to: bleeding which may require transfusion or reoperation; infection which may require antibiotics; injury to bowel, bladder, ureters or other surrounding organs; injury to the fetus; need for additional procedures including hysterectomy in the event of a life-threatening hemorrhage; formation of adhesions; placental abnormalities wth subsequent pregnancies; incisional problems; thromboembolic phenomenon and other postoperative/anesthesia complications.  The patient concurred with the proposed plan, giving informed written consent for the procedure.   Patient has been on clears since 2238 and she will remain NPO for procedure. Anesthesia and OR aware. Preoperative prophylactic antibiotics and SCDs ordered on call to the OR.  To OR when ready. #Pain: per patient request  #FWB: Cat I #GBS positive; Vanc #Severe Pre-E: BP's, HA and blurred vision. Pr/Cr 0.30. CMP WNL. Plt 136. Cont Mag and Labetalol. Will cont Mag 24 hours PP and start scheduled anti-hypertensive.  2239, MD 5:37 AM
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
## 2019-08-01 NOTE — Transfer of Care (Signed)
Immediate Anesthesia Transfer of Care Note  Patient: Brandy Sweeney  Procedure(s) Performed: CESAREAN SECTION (N/A )  Patient Location: PACU  Anesthesia Type:General  Level of Consciousness: awake, alert  and oriented  Airway & Oxygen Therapy: Patient Spontanous Breathing and Patient connected to nasal cannula oxygen  Post-op Assessment: Report given to RN and Post -op Vital signs reviewed and stable  Post vital signs: Reviewed and stable  Last Vitals:  Vitals Value Taken Time  BP 127/84 08/01/19 0800  Temp    Pulse 95 08/01/19 0809  Resp 27 08/01/19 0809  SpO2 96 % 08/01/19 0809  Vitals shown include unvalidated device data.  Last Pain:  Vitals:   07/31/19 2150  TempSrc: Oral  PainSc:          Complications: No complications documented.

## 2019-08-01 NOTE — Anesthesia Procedure Notes (Signed)
Procedure Name: Intubation Date/Time: 08/01/2019 6:50 AM Performed by: Jennelle Human, CRNA Pre-anesthesia Checklist: Patient identified, Emergency Drugs available, Suction available, Patient being monitored and Timeout performed Patient Re-evaluated:Patient Re-evaluated prior to induction Oxygen Delivery Method: Circle system utilized Preoxygenation: Pre-oxygenation with 100% oxygen Induction Type: IV induction and Rapid sequence Laryngoscope Size: Miller and 2 Grade View: Grade I Tube type: Oral Tube size: 7.0 mm Number of attempts: 1 Airway Equipment and Method: Stylet Placement Confirmation: ETT inserted through vocal cords under direct vision,  positive ETCO2,  CO2 detector and breath sounds checked- equal and bilateral Secured at: 22 cm Tube secured with: Tape Dental Injury: Teeth and Oropharynx as per pre-operative assessment

## 2019-08-01 NOTE — Progress Notes (Signed)
40ml Morphine PCA WIS with Casimiro Needle RN

## 2019-08-01 NOTE — Consult Note (Addendum)
[facility] Face-to-Face Psychiatry Consult   Reason for Consult: Medication recommendation Referring Physician:  Eure Patient Identification: Kalah Pflum MRN:  [id number] Principal Diagnosis: <principal problem not specified> Diagnosis:  Active Problems:   Schizoaffective disorder, unspecified type (HCC)   Schizoaffective disorder, depressive type (HCC)   GBS bacteriuria   Severe preeclampsia   History of cesarean section   No prenatal care in current pregnancy   Total Time spent with patient: 30 minutes  CC:  I am fine.  I have been stable, I just need to get back on my meds.  They told me once I had the baby to start my medicine back.  Subjective:   Emylia Latella is a 29 y.o. female patient admitted in early stages of labor, [redacted] weeks gestation.  She was found to have severe preeclampsia and opted for cesarean section due to multiple hip surgeries.  Patient has a history of bipolar depression, schizoaffective disorder, and anxiety.  She reports being stable for the past year and was compliant with medication up until she conceived.  When she conceived she immediately discontinued all her medications, and has remained stable.  She requested a psych consult to resume her medication.  She was previously receiving services at [facility] behavioral health for outpatient.  She reports recent inpatient admission in December 2020 when she discovered she was pregnant.  Prior to pregnancy she was taking [place], Depakote, fluoxetine.  She is interested in resuming those medications at this time.  She denies any psychosis, paranoia, hallucinations.  She does not appear to be responding to internal stimuli, external stimuli, and or preoccupied.  She denies any suicidal ideations, homicidal ideations, and or audiovisual hallucinations.  HPI:  Donisha Hoch is a 29 y.o. female G2P1001 with IUP at [redacted]w[redacted]d by 20 wk [place] presenting for contractions and found to have severe Pre-Eclampsia. She does report decreased fetal  movement since yesterday. Also reports a headache that she cannot get rid of and intermittently blurred vision for last several days. She denies No LOF, no VB, or peripheral edema, and RUQ pain.  She plans on giving the baby up for adoption. She requests Depo or POP's for birth control. She received no PNC.   Patient is alert and oriented x4.  She is calm and cooperative, and engages well with Clinical research associate.  Patient is observed lying in bed, and appears to be restless and is receiving IV magnesium.  She is interested in resuming her medication, which we have agreed upon at this time.  She is advised that [facility] is no longer located downtown and appropriate follow-up information will be provided at discharge.  We have agreed to resume oral Invega, or Depakote, and fluoxetine for management of bipolar depression and schizoaffective.  Patient appears to show much insight and improved judgment into her mental illness.  She is concerned about her overall wellbeing in addition to the babies health.  She states that her mother is going to help her take care of the baby.  She denies any new concerns about psychosocial issues.  She is able to contract for safety while in the hospital.  Past Psychiatric History: See above  Risk to Self: Is patient at risk for suicide?: No Risk to Others:   Prior Inpatient Therapy:   Prior Outpatient Therapy:    Past Medical History:  Past Medical History:  Diagnosis Date  . Postpartum depression   . Pregnant   . PTSD (post-traumatic stress disorder)   . Schizo affective schizophrenia (HCC)  Past Surgical History:  Procedure Laterality Date  . CESAREAN SECTION    . HIP SURGERY     Family History: History reviewed. No pertinent family history. Family Psychiatric  History: Denies Social History:  Social History   Substance and Sexual Activity  Alcohol Use No     Social History   Substance and Sexual Activity  Drug Use Not Currently  . Types: Marijuana     Social History   Socioeconomic History  . Marital status: Single    Spouse name: Not on file  . Number of children: Not on file  . Years of education: Not on file  . Highest education level: Not on file  Occupational History  . Not on file  Tobacco Use  . Smoking status: Current Some Day Smoker    Types: Cigarettes  . Smokeless tobacco: Never Used  Substance and Sexual Activity  . Alcohol use: No  . Drug use: Not Currently    Types: Marijuana  . Sexual activity: Not Currently    Birth control/protection: None  Other Topics Concern  . Not on file  Social History Narrative  . Not on file   Social Determinants of Health   Financial Resource Strain:   . Difficulty of Paying Living Expenses:   Food Insecurity:   . Worried About Programme researcher, broadcasting/film/video in the Last Year:   . Barista in the Last Year:   Transportation Needs:   . Freight forwarder (Medical):   Marland Kitchen Lack of Transportation (Non-Medical):   Physical Activity:   . Days of Exercise per Week:   . Minutes of Exercise per Session:   Stress:   . Feeling of Stress :   Social Connections:   . Frequency of Communication with Friends and Family:   . Frequency of Social Gatherings with Friends and Family:   . Attends Religious Services:   . Active Member of Clubs or Organizations:   . Attends Banker Meetings:   Marland Kitchen Marital Status:    Additional Social History:    Allergies:   Allergies  Allergen Reactions  . Trazodone And Nefazodone Hives    Also itching   . Penicillins Rash    Labs:  Results for orders placed or performed during the hospital encounter of 07/31/19 (from the past 48 hour(s))  CBC with Differential/Platelet     Status: Abnormal   Collection Time: 07/31/19  7:14 PM  Result Value Ref Range   WBC 9.6 4.0 - 10.5 K/uL   RBC 3.43 (L) 3.87 - 5.11 MIL/uL   Hemoglobin 10.9 (L) 12.0 - 15.0 g/dL   HCT 40.9 (L) 36 - 46 %   MCV 92.7 80.0 - 100.0 fL   MCH 31.8 26.0 - 34.0 pg   MCHC  34.3 30.0 - 36.0 g/dL   RDW 81.1 91.4 - 78.2 %   Platelets 138 (L) 150 - 400 K/uL   nRBC 0.0 0.0 - 0.2 %   Neutrophils Relative % 61 %   Neutro Abs 6.0 1.7 - 7.7 K/uL   Lymphocytes Relative 26 %   Lymphs Abs 2.5 0.7 - 4.0 K/uL   Monocytes Relative 8 %   Monocytes Absolute 0.8 0 - 1 K/uL   Eosinophils Relative 4 %   Eosinophils Absolute 0.4 0 - 0 K/uL   Basophils Relative 0 %   Basophils Absolute 0.0 0 - 0 K/uL   Immature Granulocytes 1 %   Abs Immature Granulocytes 0.05 0.00 - 0.07 K/uL  Comment: Performed at [facility] Lab, [address], [place], [place] [address]  Hepatitis B surface antigen     Status: None   Collection Time: 07/31/19  7:43 PM  Result Value Ref Range   Hepatitis B Surface Ag NON REACTIVE NON REACTIVE    Comment: Performed at [facility] Lab, [address], [place], [place] [address]  Type and screen [facility]     Status: None   Collection Time: 07/31/19  7:45 PM  Result Value Ref Range   ABO/RH(D) B POS    Antibody Screen NEG    Sample Expiration      08/03/2019,2359 Performed at [facility] Lab, [address], [place], [place] [address]   RPR     Status: None   Collection Time: 07/31/19  7:50 PM  Result Value Ref Range   RPR Ser Ql NON REACTIVE NON REACTIVE    Comment: Performed at [facility] Lab, [address] [place], [place] [address]  HIV Antibody (routine testing w rflx)     Status: None   Collection Time: 07/31/19  7:50 PM  Result Value Ref Range   HIV Screen 4th Generation wRfx Non Reactive Non Reactive    Comment: Performed at [facility] Lab, [address] [place], [place] [address]  Comprehensive metabolic panel     Status: Abnormal   Collection Time: 07/31/19  7:55 PM  Result Value Ref Range   Sodium 138 135 - 145 mmol/L   Potassium 3.2 (L) 3.5 - 5.1 mmol/L   Chloride 106 98 - 111 mmol/L   CO2 24 22 - 32 mmol/L   Glucose, Bld 96 70 - 99 mg/dL    Comment: Glucose reference range applies only  to samples taken after fasting for at least 8 hours.   BUN 7 6 - 20 mg/dL   Creatinine, Ser 6.29 0.44 - 1.00 mg/dL   Calcium 8.3 (L) 8.9 - 10.3 mg/dL   Total Protein 5.7 (L) 6.5 - 8.1 g/dL   Albumin 2.6 (L) 3.5 - 5.0 g/dL   AST 13 (L) 15 - 41 U/L   ALT 11 0 - 44 U/L   Alkaline Phosphatase 95 38 - 126 U/L   Total Bilirubin 0.3 0.3 - 1.2 mg/dL   GFR calc non Af Amer >60 >60 mL/min   GFR calc Af Amer >60 >60 mL/min   Anion gap 8 5 - 15    Comment: Performed at [facility] Lab, [address], [place], [place] [address]  CBC     Status: Abnormal   Collection Time: 07/31/19  7:55 PM  Result Value Ref Range   WBC 9.9 4.0 - 10.5 K/uL   RBC 3.43 (L) 3.87 - 5.11 MIL/uL   Hemoglobin 10.9 (L) 12.0 - 15.0 g/dL   HCT 65.0 (L) 36 - 46 %   MCV 93.3 80.0 - 100.0 fL   MCH 31.8 26.0 - 34.0 pg   MCHC 34.1 30.0 - 36.0 g/dL   RDW 35.4 65.6 - 81.2 %   Platelets 136 (L) 150 - 400 K/uL   nRBC 0.0 0.0 - 0.2 %    Comment: Performed at [facility] Lab, [address] [place], [place] [address]  SARS Coronavirus 2 by RT PCR (hospital order, performed in [facility] hospital lab) Nasopharyngeal Nasopharyngeal Swab     Status: None   Collection Time: 07/31/19  8:00 PM   Specimen: Nasopharyngeal Swab  Result Value  Ref Range   SARS Coronavirus 2 NEGATIVE NEGATIVE    Comment: (NOTE) SARS-CoV-2 target nucleic acids are NOT DETECTED.  The SARS-CoV-2 RNA is generally detectable in upper and lower respiratory specimens during the acute phase of infection. The lowest concentration of SARS-CoV-2 viral copies this assay can detect is 250 copies / mL. A negative result does not preclude SARS-CoV-2 infection and should not be used as the sole basis for treatment or other patient management decisions.  A negative result may occur with improper specimen collection / handling, submission of specimen other than nasopharyngeal swab, presence of viral mutation(s) within the areas targeted by this assay, and  inadequate number of viral copies (<250 copies / mL). A negative result must be combined with clinical observations, patient history, and epidemiological information.  Fact Sheet for Patients:   BoilerBrush.com.cy  Fact Sheet for Healthcare Providers: https://pope.com/  This test is not yet approved or  cleared by the [place] FDA and has been authorized for detection and/or diagnosis of SARS-CoV-2 by FDA under an Emergency Use Authorization (EUA).  This EUA will remain in effect (meaning this test can be used) for the duration of the COVID-19 declaration under Section 564(b)(1) of the Act, 21 U.S.C. section 360bbb-3(b)(1), unless the authorization is terminated or revoked sooner.  Performed at [facility] Lab, [address], [place], [place] [address]   ABO/Rh     Status: None   Collection Time: 07/31/19  8:19 PM  Result Value Ref Range   ABO/RH(D)      B POS Performed at [facility], [address], [place], [place] [address]   Protein / creatinine ratio, urine     Status: Abnormal   Collection Time: 08/01/19  2:56 AM  Result Value Ref Range   Creatinine, Urine 128.12 mg/dL   Total Protein, Urine 38 mg/dL    Comment: NO NORMAL RANGE ESTABLISHED FOR THIS TEST   Protein Creatinine Ratio 0.30 (H) 0.00 - 0.15 mg/mg[Cre]    Comment: Performed at [facility] Lab, [address] [place], [place] [address]  Rapid urine drug screen (hospital performed)     Status: None   Collection Time: 08/01/19  2:56 AM  Result Value Ref Range   Opiates NONE DETECTED NONE DETECTED   Cocaine NONE DETECTED NONE DETECTED   Benzodiazepines NONE DETECTED NONE DETECTED   Amphetamines NONE DETECTED NONE DETECTED   Tetrahydrocannabinol NONE DETECTED NONE DETECTED   Barbiturates NONE DETECTED NONE DETECTED    Comment: (NOTE) DRUG SCREEN FOR MEDICAL PURPOSES ONLY.  IF CONFIRMATION IS NEEDED FOR ANY PURPOSE, NOTIFY LAB WITHIN 5  DAYS.  LOWEST DETECTABLE LIMITS FOR URINE DRUG SCREEN Drug Class                     Cutoff (ng/mL) Amphetamine and metabolites    1000 Barbiturate and metabolites    200 Benzodiazepine                 200 Tricyclics and metabolites     300 Opiates and metabolites        300 Cocaine and metabolites        300 THC                            50 Performed at [facility] Lab, [address], [place], [place] [address]   Urinalysis, Routine w reflex microscopic     Status: Abnormal  Collection Time: 08/01/19  2:56 AM  Result Value Ref Range   Color, Urine YELLOW YELLOW   APPearance CLEAR CLEAR   Specific Gravity, Urine 1.017 1.005 - 1.030   pH 7.0 5.0 - 8.0   Glucose, UA NEGATIVE NEGATIVE mg/dL   Hgb urine dipstick LARGE (A) NEGATIVE   Bilirubin Urine NEGATIVE NEGATIVE   Ketones, ur NEGATIVE NEGATIVE mg/dL   Protein, ur 30 (A) NEGATIVE mg/dL   Nitrite NEGATIVE NEGATIVE   Leukocytes,Ua NEGATIVE NEGATIVE   RBC / HPF 0-5 0 - 5 RBC/hpf   WBC, UA 0-5 0 - 5 WBC/hpf   Bacteria, UA NONE SEEN NONE SEEN   Squamous Epithelial / LPF 0-5 0 - 5   Mucus PRESENT     Comment: Performed at [facility] Lab, [address], [place], [place] [address]  CBC     Status: Abnormal   Collection Time: 08/01/19  3:45 AM  Result Value Ref Range   WBC 8.1 4.0 - 10.5 K/uL   RBC 3.30 (L) 3.87 - 5.11 MIL/uL   Hemoglobin 10.7 (L) 12.0 - 15.0 g/dL   HCT 60.4 (L) 36 - 46 %   MCV 93.6 80.0 - 100.0 fL   MCH 32.4 26.0 - 34.0 pg   MCHC 34.6 30.0 - 36.0 g/dL   RDW 54.0 98.1 - 19.1 %   Platelets 110 (L) 150 - 400 K/uL    Comment: Immature Platelet Fraction may be clinically indicated, consider ordering this additional test [id number] PLATELET COUNT CONFIRMED BY SMEAR REPEATED TO VERIFY    nRBC 0.0 0.0 - 0.2 %    Comment: Performed at [facility] Lab, [address], [place], [place] [address]    Current Facility-Administered Medications  Medication Dose Route Frequency Provider Last Rate Last  Admin  . acetaminophen (TYLENOL) tablet 650 mg  650 mg Oral Q4H PRN Fair, Chelsea N, MD      . buPROPion (WELLBUTRIN XL) 24 hr tablet 300 mg  300 mg Oral Daily Fair, Hoyle Sauer, MD   300 mg at 08/01/19 1416  . coconut oil  1 application Topical PRN Fair, Hoyle Sauer, MD      . witch hazel-glycerin (TUCKS) pad 1 application  1 application Topical PRN Fair, Hoyle Sauer, MD       And  . dibucaine (NUPERCAINAL) 1 % rectal ointment 1 application  1 application Rectal PRN Fair, Hoyle Sauer, MD      . diphenhydrAMINE (BENADRYL) capsule 25 mg  25 mg Oral Q6H PRN Fair, Hoyle Sauer, MD      . diphenhydrAMINE (BENADRYL) injection 12.5 mg  12.5 mg Intravenous Q6H PRN Warden Fillers, MD      . Melene Muller ON 08/02/2019] enoxaparin (LOVENOX) injection 40 mg  40 mg Subcutaneous Q24H Fair, Chelsea N, MD      . FLUoxetine (PROZAC) capsule 10 mg  10 mg Oral Daily Lazaro Arms, MD      . ketorolac (TORADOL) 30 MG/ML injection 30 mg  30 mg Intravenous Q6H Fair, Hoyle Sauer, MD   30 mg at 08/01/19 1158   Followed by  . [START ON 08/02/2019] ibuprofen (ADVIL) tablet 800 mg  800 mg Oral Q6H Fair, Chelsea N, MD      . lactated ringers infusion   Intravenous Continuous Fair, Chelsea N, MD      . magnesium sulfate 40 grams in SWI 1000 mL OB infusion  2 g/hr Intravenous Titrated Joselyn Arrow, MD 50 mL/hr at 08/01/19 1416 2  g/hr at 08/01/19 1416  . menthol-cetylpyridinium (CEPACOL) lozenge 3 mg  1 lozenge Oral Q2H PRN Fair, Hoyle Sauer, MD      . morphine 2 mg/mL PCA injection   Intravenous Q4H Warden Fillers, MD   Set-up / Initial Syringe at 08/01/19 1247  . naloxone [facility]) injection 0.4 mg  0.4 mg Intravenous PRN Warden Fillers, MD       And  . sodium chloride flush (NS) 0.9 % injection 9 mL  9 mL Intravenous PRN Warden Fillers, MD      . ondansetron [facility]) injection 4 mg  4 mg Intravenous Q6H PRN Warden Fillers, MD      . oxyCODONE (Oxy IR/ROXICODONE) immediate release tablet 5-10 mg  5-10 mg Oral Q4H PRN Fair, Hoyle Sauer,  MD      . oxytocin (PITOCIN) IV infusion 30 units in NS 500 mL - Premix  2.5 Units/hr Intravenous Continuous Fair, Hoyle Sauer, MD      . prenatal multivitamin tablet 1 tablet  1 tablet Oral Q1200 Fair, Hoyle Sauer, MD      . Melene Muller ON 08/02/2019] senna-docusate (Senokot-S) tablet 2 tablet  2 tablet Oral Q24H Fair, Hoyle Sauer, MD      . simethicone (MYLICON) chewable tablet 80 mg  80 mg Oral TID PC Fair, Hoyle Sauer, MD      . Melene Muller ON 08/02/2019] simethicone (MYLICON) chewable tablet 80 mg  80 mg Oral Q24H Fair, Chelsea N, MD      . simethicone (MYLICON) chewable tablet 80 mg  80 mg Oral PRN Fair, Hoyle Sauer, MD      . Melene Muller ON 08/02/2019] Tdap (BOOSTRIX) injection 0.5 mL  0.5 mL Intramuscular Once Fair, Chelsea N, MD      . zolpidem (AMBIEN) tablet 5 mg  5 mg Oral QHS PRN Fair, Hoyle Sauer, MD        Musculoskeletal: Strength & Muscle Tone: within normal limits Gait & Station: normal Patient leans: N/A  Psychiatric Specialty Exam: Physical Exam  Review of Systems  Blood pressure 120/82, pulse 92, temperature 98.2 F (36.8 C), temperature source Oral, resp. rate 16, height 5\' 7"  (1.702 m), weight 115.7 kg, last menstrual period 10/25/2018, SpO2 100 %, unknown if currently breastfeeding.Body mass index is 39.95 kg/m.  General Appearance: Fairly Groomed  Eye Contact:  Fair  Speech:  Clear and Coherent and Normal Rate  Volume:  Normal  Mood:  Euthymic  Affect:  Appropriate and Congruent  Thought Process:  Coherent, Linear and Descriptions of Associations: Intact  Orientation:  Full (Time, Place, and Person)  Thought Content:  Logical  Suicidal Thoughts:  No  Homicidal Thoughts:  No  Memory:  Immediate;   Fair Recent;   Fair  Judgement:  Intact  Insight:  Fair  Psychomotor Activity:  Normal  Concentration:  Concentration: Fair and Attention Span: Fair  Recall:  12/25/2018 of Knowledge:  Fair  Language:  Fair  Akathisia:  No  Handed:  Right  AIMS (if indicated):     Assets:  Communication  Skills Desire for Improvement Financial Resources/Insurance Housing Physical Health Social Support Talents/Skills Transportation Vocational/Educational  ADL's:  Intact  Cognition:  WNL  Sleep:        Treatment Plan Summary: Plan Will resume home medications to include oral Invega, Depakote, and fluoxetine.  Will titrate medications as appropriate.  Will start oral dosing of Invega with hopes for patient to receive long-acting Fiserv prior to discharge.  This is  discussed with patient and she verbalizes understanding.  Will start Invega 3mg  p.o. daily, fluoxetine 10 mg p.o. daily and Depakote 125 p.o. twice daily.  [facility] for ongoing management of medications to include long-acting injectable Gean BirchwoodInvega Sustenna.  [facility] IM and a single dose prior to discharge.  This has been discussed with attending Dr. Ellery PlunkBest.  Disposition: No evidence of imminent risk to self or others at present.   Patient does not meet criteria for psychiatric inpatient admission. Supportive therapy provided about ongoing stressors. Discussed crisis plan, support from social network, calling 911, coming to the Emergency Department, and calling Suicide Hotline.  Maryagnes Amosakia S Starkes-Perry, FNP 08/01/2019 2:36 PM

## 2019-08-01 NOTE — Progress Notes (Signed)
RN informed CSW that MOB's sister ([name] [phone]) is requesting to meet with CSW.   CSW met with MOB's sister in comfort room. CSW explained that CSW can not disclose any information and can only obtain collateral information. MOB's sister reported that she currently cares for MOB's older child (Trinton Hinkley 09/10/13) and denied that MOB had any CPS history. MOB's sister reported that MOB's home is not suitable for a child and MOB does not have the capacity to care for a child. MOB's sister reported that MOB has been off her medications since July or August 2020 and that she was on an injection. MOB's sister reported that MOB needs to be back on her medications and requested a psychiatry consult. MOB's sister reported that MOB had a recent psychotic episode when MOB informed her family that she had a stillborn on Tuesday. MOB's sister reported that she reached out to [employer] in regards to ACT services for MOB and is waiting to hear back. MOB's sister reported that they prefer that she, her aunt or mom adopts infant. MOB's sister reported that if that is not an option they prefer an open adoption. MOB's sister reported that she is unaware if MOB used any substances during pregnancy. MOB's sister reported that MOB is her own legal guardian. MOB's sister inquired about adoption process, CSW explained adoption process (agency adoption versus private adoption (with an attorney). CSW agreed to contact MOB's sister if MOB provides consent.   CSW completed chart review and will meet with MOB once magnesium is discontinued.   CSW updated RN about MOB's sister request for psychiatry consult.   Taiyana Kissler, LCSW Clinical Social Worker [facility] Cell#: [phone]           

## 2019-08-01 NOTE — Op Note (Addendum)
Cesarean Section Procedure Note   Brandy Sweeney  08/01/2019  Indications: Declined further TOLAC   Pre-operative Diagnosis: Elective Repeat/declined TOLAC; Severe Pre-eclampsia   Post-operative Diagnosis: Same   Surgeon: Surgeon(s) and Role:    * Eure, Amaryllis Dyke, MD - Primary    * Jenevieve Kirschbaum, Hoyle Sauer, MD - Assisting  Anesthesia: general    Estimated Blood Loss: 448 mL   Total IV Fluids: 1400 ml   Urine Output: 450 mL  Specimens: pathology sent to L&D  Findings: Viable female infant in cephalic presentation. Meconium-stained amniotic fluid.  Intact placenta, three vessel cord.  Normal uterus, fallopian tubes and ovaries bilaterally. APGAR (1 MIN): 8   APGAR (5 MINS): 9   APGAR (10 MINS):    Baby condition / location:  Nursery 3435 gm   Complications: no complications  Indications: Brandy Sweeney is a 29 y.o. [id number] with an IUP [redacted]w[redacted]d presenting in early labor and found to have severe Pre-E. She initially desired TOLAC and was on L&D for several hours and arrived to 4 cm and then declined further TOLAC due to discomfort.  The risks, benefits, complications, treatment options, and expected outcomes were discussed with the patient . The patient concurred with the proposed plan, giving informed consent. identified as Brandy Sweeney and the procedure verified as C-Section Delivery.  Procedure Details: A Time Out was held and the above information confirmed.  The patient was taken back to the operative suite where general anesthesia was placed.  After induction of anesthesia, the patient was draped and prepped in the usual sterile manner and placed in a dorsal supine position with a leftward tilt. A Pfannenstiel incision was made and carried down through the subcutaneous tissue to the fascia. Fascial incision was made and extended transversely. The fascia was separated from the underlying rectus tissue superiorly and inferiorly. The peritoneum was identified and entered. Peritoneal incision was  extended longitudinally. A low transverse uterine incision was made. Delivered from cephalic presentation was a 3435 gram Female with Apgar scores of 8 at one minute and 9 at five minutes. Cord ph was sent the umbilical cord was clamped and cut cord blood was obtained for evaluation. The placenta was removed Intact and appeared normal. The uterine outline, tubes and ovaries appeared normal. The uterine incision was closed with running locked sutures of 1-0 Monocryl. Peritoneum closed with interrupted 0 Chromic. Hemostasis was observed. Lavage was carried out until clear. The fascia was then reapproximated with running sutures of 0 Vicryl. The subcuticular closure was performed using 2-0 plain gut. The skin was closed with 4-0 Vicryl.   Instrument, sponge, and needle counts were correct prior the abdominal closure and were correct at the conclusion of the case.     Disposition: PACU - hemodynamically stable.   Maternal Condition: stable   Jerilynn Birkenhead, MD [facility] Family Medicine Fellow, [facility] for [employer], [facility]

## 2019-08-01 NOTE — Progress Notes (Signed)
Labor Progress Note Brandy Sweeney is a 29 y.o. G2P1001 at [redacted]w[redacted]d presented for early labor and admitted for severe Pre-E. S: Unable to chart earlier as Epic down. Now more uncomfortable and desires epidural.   O:  BP 124/81   Pulse 80   Temp 98.3 F (36.8 C)   Resp 20   Ht 5\' 7"  (1.702 m)   Wt 115.7 kg   LMP 10/25/2018   SpO2 99%   BMI 39.95 kg/m  EFM: 135, moderate variability, pos accels, some early decels, reactive TOCO: q2-77m  CVE: Dilation: 4 Effacement (%): 70 Cervical Position: Posterior Station: -2 Presentation: Vertex Exam by:: Dr. [email or web address]    A&P: 29 y.o. G2P1001 [redacted]w[redacted]d here for early labor and admitted for Severe Pre-E. #Labor: Progressing well. Cont Pitocin. Will plan to AROM when comfortable with epidural. Anticipate VBAC. #Pain: per patient request  #FWB: Cat I #GBS positive; Vanc #Severe Pre-E: BP's, HA and blurred vision. Pr/Cr 0.30. CMP WNL. Plt 136. Cont Mag and Labetalol.   [redacted]w[redacted]d, MD 4:10 AM

## 2019-08-01 NOTE — Anesthesia Postprocedure Evaluation (Signed)
Anesthesia Post Note  Patient: Brandy Sweeney  Procedure(s) Performed: CESAREAN SECTION (N/A )     Patient location during evaluation: PACU Anesthesia Type: General Level of consciousness: sedated Pain management: pain level controlled Vital Signs Assessment: post-procedure vital signs reviewed and stable Respiratory status: spontaneous breathing and respiratory function stable Cardiovascular status: stable Postop Assessment: no apparent nausea or vomiting Anesthetic complications: no   No complications documented.  Last Vitals:  Vitals:   08/01/19 1426 08/01/19 1533  BP: 120/82   Pulse: 92   Resp: 16   Temp: 36.8 C   SpO2: 100% 97%    Last Pain:  Vitals:   08/01/19 1533  TempSrc:   PainSc: Asleep   Pain Goal:                   Remonia Richter

## 2019-08-02 ENCOUNTER — Encounter (HOSPITAL_COMMUNITY): Payer: Self-pay | Admitting: Obstetrics & Gynecology

## 2019-08-02 LAB — CBC
HCT: 30.9 % — ABNORMAL LOW (ref 36.0–46.0)
Hemoglobin: 10.6 g/dL — ABNORMAL LOW (ref 12.0–15.0)
MCH: 32.3 pg (ref 26.0–34.0)
MCHC: 34.3 g/dL (ref 30.0–36.0)
MCV: 94.2 fL (ref 80.0–100.0)
Platelets: 130 10*3/uL — ABNORMAL LOW (ref 150–400)
RBC: 3.28 MIL/uL — ABNORMAL LOW (ref 3.87–5.11)
RDW: 13.4 % (ref 11.5–15.5)
WBC: 8.4 10*3/uL (ref 4.0–10.5)
nRBC: 0 % (ref 0.0–0.2)

## 2019-08-02 LAB — RUBELLA SCREEN: Rubella: 3.29 index (ref >0.99)

## 2019-08-02 MED ORDER — MEDROXYPROGESTERONE ACETATE 150 MG/ML IM SUSP
150.0000 mg | Freq: Once | INTRAMUSCULAR | Status: DC
  Filled 2019-08-02: qty 1

## 2019-08-02 MED ORDER — DIVALPROEX SODIUM 125 MG PO DR TAB: 125.0000 mg | DELAYED_RELEASE_TABLET | Freq: Two times a day (BID) | ORAL | 2 refills | Status: DC

## 2019-08-02 MED ORDER — FLUOXETINE HCL 10 MG PO CAPS: 10.0000 mg | ORAL_CAPSULE | Freq: Every day | ORAL | 3 refills | Status: DC

## 2019-08-02 MED ORDER — PALIPERIDONE ER 3 MG PO TB24: 3.0000 mg | ORAL_TABLET | Freq: Every day | ORAL | 2 refills | Status: DC

## 2019-08-02 MED ORDER — SENNOSIDES-DOCUSATE SODIUM 8.6-50 MG PO TABS: 2.0000 | ORAL_TABLET | Freq: Every evening | ORAL | 2 refills | Status: DC | PRN

## 2019-08-02 MED ORDER — PNEUMOCOCCAL VAC POLYVALENT 25 MCG/0.5ML IJ INJ
0.5000 mL | INJECTION | INTRAMUSCULAR | Status: DC
  Filled 2019-08-02: qty 0.5

## 2019-08-02 MED ORDER — OXYCODONE HCL 5 MG PO TABS: 5.0000 mg | ORAL_TABLET | Freq: Four times a day (QID) | ORAL | 0 refills | Status: DC | PRN

## 2019-08-02 MED ORDER — NIFEDIPINE ER OSMOTIC RELEASE 30 MG PO TB24
30.0000 mg | ORAL_TABLET | Freq: Every day | ORAL | Status: DC
  Administered 2019-08-02 – 2019-08-03 (×2): 30 mg via ORAL
  Filled 2019-08-02 (×2): qty 1

## 2019-08-02 MED ORDER — NIFEDIPINE ER 30 MG PO TB24: 30.0000 mg | ORAL_TABLET | Freq: Every day | ORAL | 0 refills | Status: DC

## 2019-08-02 MED ORDER — PALIPERIDONE ER 3 MG PO TB24: 3.0000 mg | ORAL_TABLET | Freq: Two times a day (BID) | ORAL | 2 refills | Status: DC

## 2019-08-02 MED ORDER — IBUPROFEN 800 MG PO TABS: 800.0000 mg | ORAL_TABLET | Freq: Three times a day (TID) | ORAL | 0 refills | Status: DC | PRN

## 2019-08-02 MED FILL — FLUoxetine HCL 10 MG CAPS: 10 | 30 days supply | Qty: 30 | Fill #0

## 2019-08-02 MED FILL — oxyCODONE HCL 5 MG TABS: 5 | 7 days supply | Qty: 30 | Fill #0

## 2019-08-02 MED FILL — PALIPERIDONE ER 3 MG TB24: 3 | 30 days supply | Qty: 30 | Fill #0

## 2019-08-02 MED FILL — SENEXON-S 8.6-50 MG TABS: 8.6-50 | 15 days supply | Qty: 30 | Fill #0

## 2019-08-02 MED FILL — IBUPROFEN 800 MG TAB: 800 | 10 days supply | Qty: 30 | Fill #0

## 2019-08-02 MED FILL — NIFEdipine ER 30 MG TB24: 30 | 30 days supply | Qty: 30 | Fill #0

## 2019-08-02 MED FILL — DIVALPROEX SODIUM 125 MG CA: 125 | 30 days supply | Qty: 60 | Fill #0

## 2019-08-02 NOTE — Progress Notes (Signed)
Postpartum Day 1: Cesarean Delivery at [redacted]w[redacted]d, in the setting of no prenatal care, severe preeclampsia, schizoaffective disorder  Subjective: Patient denies any headaches, visual symptoms, RUQ/epigastric pain or other concerning symptoms. Patient reports incisional pain and tolerating PO.  No flatus yet.  Foley still in place.  Objective: Vital signs in last 24 hours: Temp:  [97.7 F (36.5 C)-98.2 F (36.8 C)] 97.8 F (36.6 C) (07/09 0549) Pulse Rate:  [83-102] 102 (07/09 0549) Resp:  [0-66] 18 (07/09 0616) BP: (94-148)/(53-117) 132/88 (07/09 0549) SpO2:  [91 %-100 %] 100 % (07/09 0549)  Physical Exam:  General: alert and no distress Lochia: appropriate Uterine Fundus: firm Incision: dressing in place, no drainage DVT Evaluation: No evidence of DVT seen on physical exam.  Negative Homan's sign.  No cords or calf tenderness. No significant calf/ankle edema.  Recent Labs    08/01/19 0345 08/02/19 0540  HGB 10.7* 10.6*  HCT 30.9* 30.9*    Assessment/Plan: Status post Cesarean section. Doing well postoperatively.  - Procardia XL 30 mg daily initiated for BP control. Now off magnesium sulfate, no concerning symptoms. Continue to monitor BP. -Appreciate Psychiatry recommendations, will continue current psychotropic medications as recommended and patient will have outpatient follow up -Patient is interested in adoption of infant, SW working with her to coordinate this -Desires Depo Provera for contraception, will get prior to discharge -Foley to be discontinued, encouraged OOB/ambulation -Continue routine postpartum care, hope to discharge tomorrow if remains stable.  Discharge medications ordered vis Meds to [facility] consult in preparation for discharge over the weekend.   Jaynie Collins, MD 08/02/2019, 7:37 AM

## 2019-08-02 NOTE — Clinical Social Work Maternal (Addendum)
CLINICAL SOCIAL WORK MATERNAL/CHILD NOTE  Patient Details  Name: Brandy Sweeney MRN: [id number] Date of Birth: 07-09-1990  Date:  08/02/2019  Clinical Social Worker Initiating Note:  Abundio Miu, [place] Date/Time: Initiated:  08/02/19/1022     Child's Name:  Brandy Sweeney   Biological Parents:  Mother, Father (Father: Brandy Sweeney)   Need for Interpreter:  None   Reason for Referral:  Behavioral Health Concerns, Adoption, Late or No Prenatal Care    Address:  [place] [address]    Phone number:  [phone] (home)     Additional phone number:   Household Members/Support Persons (HM/SP):       HM/SP Name Relationship DOB or Age  HM/SP -1        HM/SP -2        HM/SP -3        HM/SP -4        HM/SP -5        HM/SP -6        HM/SP -7        HM/SP -8          Natural Supports (not living in the home):  Parent, Immediate Family   Professional Supports: None   Employment: Unemployed   Type of Work:     Education:  Programmer, systems   Homebound arranged:    Museum/gallery curator Resources:  Medicaid   Other Resources:  [employer], Physicist, medical    Cultural/Religious Considerations Which May Impact Care:    Strengths:  Psychotropic Medications   Psychotropic Medications:  Wellbutrin, Prozac, Other meds (Depakote; Kirt Boys)      Pediatrician:       Pediatrician List:   [facility]      Pediatrician Fax Number:    Risk Factors/Current Problems:  Mental Health Concerns    Cognitive State:  Able to Concentrate , Alert , Goal Oriented , Insightful , Linear Thinking    Mood/Affect:  Calm , Interested    CSW Assessment: CSW met with MOB at bedside to discuss consult for behavioral health concerns, no prenatal care and adoption. MOB's sister was present. CSW introduced self and explained reason for consult. MOB granted CSW verbal  permission to speak in front of her sister about anything. MOB presented calm and remained engaged during assessment, MOB's sister also participated in assessment. MOB reported that she resides alone and is unemployed. MOB reported that she receives both [facility] and food stamps. MOB reported that she had started shopping for infant but FOB came over and messed everything up. MOB reported that she has money to buy all items needed for infant. CSW asked if a baby bundle would be helpful, MOB reported yes. CSW asked if MOB felt safe returning to her home, MOB reported yes and that FOB is incarcerated. CSW inquired about MOB's support system, MOB reported that her mom and sister are supports. MOB reported that her son Brandy Sweeney 09/10/13) resides with her sister Brandy Sween[facility] [place] [address]).   CSW inquired about MOB's mental health history. MOB reported that she was diagnosed with depression, MOB's sister reported that MOB was diagnosed with Schizoaffective Bipolar Type Disorder at 29 years old. CSW inquired about any current symptoms, MOB reported none. CSW inquired about therapy and medication. MOB reported that she  just restarted her medications and was unable to recall which medications she was on. MOB reported that she plans to restart therapy. MOB's sister reported that she is working on getting MOB set up with [facility] and waiting for a return call. MOB and MOB's sister reported that [facility] can provide therapy and medication management. CSW inquired about any postpartum depression history, MOB reported no and MOB's sister reported yes. CSW about MOB's postpartum depression experience, MOB reported that symptoms started when her son was 85-59 weeks old and she experienced depression. MOB's sister reported that MOB also experienced psychosis. CSW inquired about how MOB was feeling emotionally after giving birth, MOB reported that she was feeling okay. MOB presented  calm and did demonstrate any acute mental health signs/symptoms. CSW assessed for safety, MOB denied SI,HI and domestic violence.   CSW provided education regarding the baby blues period vs. perinatal mood disorders, discussed treatment and gave resources for mental health follow up if concerns arise.  CSW recommends self-evaluation during the postpartum time period using the New Mom Checklist from Postpartum Progress and encouraged MOB to contact a medical professional if symptoms are noted at any time.   CSW inquired about MOB's adoption plan, MOB reported that she is no longer interested in adoption and that her mom Brandy Sweeney, [place]) will be caring for infant. MOB's sister reported that their mom will be coming tomorrow. MOB's sister reported that she will speak with their mom about selecting a pediatrician. MOB reported that infant will be cared for by her mom until she is able to get everything together.   CSW informed MOB about the hospital drug screen policy due to no prenatal care. MOB confirmed no prenatal care. CSW inquired about any barriers to MOB getting prenatal care, MOB reported that her medicaid was a barrier. MOB reported that her medicaid card came in the mail now. MOB's sister reported that MOB only has family planning medicaid and wanted MOB to do a medicaid application while at the hospital. MOB reported that she now has full medicaid, MOB's sister reported that she was going to check into it. CSW inquired about any substance use during pregnancy, MOB reported none. CSW informed MOB that infant's UDS was negative and CDS would continue to be monitored and a CPS report will be made if warranted. MOB verbalized understanding and denied any questions. MOB denied any CPS history.   CSW did not review Sudden Infant Death Syndrome (SIDS) precautions, as infant will be staying with MOB's mom and MOB's mom was not present during assessment. RN will go over SIDS during  discharge teaching.   CSW identifies no further need for intervention and no barriers to discharge at this time. Infant will dc to MOB and MOB reported that infant will be going to stay with her mom. MOB's sister reported that MOB's mom will be coming to the hospital tomorrow.   CSW Plan/Description:  Perinatal Mood and Anxiety Disorder (PMADs) Education, [place], CSW Will Continue to Monitor Umbilical Cord Tissue Drug Screen Results and Make Report if Barbette Or, LCSW 08/02/2019, 10:38 AM

## 2019-08-03 NOTE — Discharge Instructions (Signed)

## 2019-08-03 NOTE — Progress Notes (Signed)
CSW was contacted by RN to assist with confirming discharge plan. This CSW contacted CSW Cala Bradford Long to gain clarification regarding situation. CSW Long confirmed plan; baby is to discharge to MOB, and MOB plans to give baby to [facility] Mauro Kaufmann for care at discharge, at [facility] request. Therefore, today, this CSW visited MOB to confirm plan remains. CSW entered room, introduced self, and stated reason for visit. MOB's sister was present and holding baby. MOB stated plan remains the same and MGM will be here around 12:30 pm to pick them up and care for baby. MOB's sister verbally confirmed plan as well.   CSW identifies no further need for intervention and no barriers to discharge at this time.   Lavinia Mcneely D. Dortha Kern, MSW, [facility] Clinical Social Worker [phone]

## 2019-08-05 ENCOUNTER — Emergency Department (HOSPITAL_COMMUNITY)
Admission: EM | Admit: 2019-08-05 | Discharge: 2019-08-07 | Disposition: A | Discharge status: Home / Self Care | Payer: Medicaid Other | Attending: Emergency Medicine | Admitting: Emergency Medicine

## 2019-08-05 ENCOUNTER — Telehealth: Payer: Self-pay | Admitting: General Practice

## 2019-08-05 DIAGNOSIS — Z79899 Other long term (current) drug therapy: Secondary | ICD-10-CM | POA: Diagnosis not present

## 2019-08-05 DIAGNOSIS — F23 Brief psychotic disorder: Secondary | ICD-10-CM | POA: Diagnosis not present

## 2019-08-05 DIAGNOSIS — F17210 Nicotine dependence, cigarettes, uncomplicated: Secondary | ICD-10-CM | POA: Diagnosis not present

## 2019-08-05 DIAGNOSIS — Z046 Encounter for general psychiatric examination, requested by authority: Secondary | ICD-10-CM | POA: Insufficient documentation

## 2019-08-05 DIAGNOSIS — O99345 Other mental disorders complicating the puerperium: Secondary | ICD-10-CM | POA: Diagnosis not present

## 2019-08-05 DIAGNOSIS — Z20822 Contact with and (suspected) exposure to covid-19: Secondary | ICD-10-CM | POA: Diagnosis not present

## 2019-08-05 MED ORDER — ZIPRASIDONE MESYLATE 20 MG IM SOLR
INTRAMUSCULAR | Status: AC
  Filled 2019-08-05: qty 20

## 2019-08-05 MED ORDER — DIVALPROEX SODIUM 125 MG PO DR TAB
125.0000 mg | DELAYED_RELEASE_TABLET | Freq: Two times a day (BID) | ORAL | Status: DC
  Administered 2019-08-06 – 2019-08-07 (×4): 125 mg via ORAL
  Filled 2019-08-05 (×6): qty 1

## 2019-08-05 MED ORDER — LORAZEPAM 2 MG/ML IJ SOLN
2.0000 mg | Freq: Once | INTRAMUSCULAR | Status: AC
  Administered 2019-08-06: 2 mg via INTRAMUSCULAR
  Filled 2019-08-05: qty 1

## 2019-08-05 MED ORDER — ZIPRASIDONE MESYLATE 20 MG IM SOLR: 20.0000 mg | INTRAMUSCULAR | Status: DC | PRN

## 2019-08-05 MED ORDER — ACETAMINOPHEN 500 MG PO TABS: 500.0000 mg | ORAL_TABLET | Freq: Four times a day (QID) | ORAL | Status: DC | PRN

## 2019-08-05 MED ORDER — HALOPERIDOL LACTATE 5 MG/ML IJ SOLN
5.0000 mg | Freq: Once | INTRAMUSCULAR | Status: AC
  Administered 2019-08-05: 10 mg via INTRAMUSCULAR
  Filled 2019-08-05: qty 1

## 2019-08-05 MED ORDER — FLUOXETINE HCL 10 MG PO CAPS
10.0000 mg | ORAL_CAPSULE | Freq: Every day | ORAL | Status: DC
  Administered 2019-08-06 – 2019-08-07 (×2): 10 mg via ORAL
  Filled 2019-08-05 (×3): qty 1

## 2019-08-05 MED ORDER — PALIPERIDONE ER 3 MG PO TB24
3.0000 mg | ORAL_TABLET | Freq: Every day | ORAL | Status: DC
  Administered 2019-08-06 – 2019-08-07 (×2): 3 mg via ORAL
  Filled 2019-08-05 (×4): qty 1

## 2019-08-05 MED ORDER — OLANZAPINE 5 MG PO TBDP
10.0000 mg | ORAL_TABLET | Freq: Three times a day (TID) | ORAL | Status: DC | PRN
  Filled 2019-08-05: qty 2

## 2019-08-05 MED ORDER — STERILE WATER FOR INJECTION IJ SOLN
INTRAMUSCULAR | Status: AC
  Filled 2019-08-05: qty 10

## 2019-08-05 MED ORDER — LORAZEPAM 1 MG PO TABS: 1.0000 mg | ORAL_TABLET | ORAL | Status: DC | PRN

## 2019-08-05 MED ORDER — NIFEDIPINE ER OSMOTIC RELEASE 30 MG PO TB24
30.0000 mg | ORAL_TABLET | Freq: Every day | ORAL | Status: DC
  Administered 2019-08-06 – 2019-08-07 (×2): 30 mg via ORAL
  Filled 2019-08-05 (×3): qty 1

## 2019-08-05 NOTE — ED Notes (Signed)
PT requested to void. PT counseled that they had to use purewick. While placing purewick, pt was spitting at the nurse and trying to head bunt.

## 2019-08-05 NOTE — Telephone Encounter (Signed)
-----   Message from York Ram sent at 08/02/2019 12:43 PM EDT ----- Regarding: FW: PP VISIT  ----- Message ----- From: Joselyn Arrow, MD Sent: 08/01/2019   8:29 AM EDT To: [facility] Admin Pool Subject: PP VISIT                                       Please schedule this patient for a In person postpartum visit in 4 weeks with the following provider: Any provider. Additional Postpartum F/U:Postpartum Depression checkup, Incision check 1 week and BP check 1 week  High risk pregnancy complicated by: HTN Delivery mode:  C-Section, Low Transverse  Anticipated Birth Control:  POPs and Depo

## 2019-08-05 NOTE — ED Notes (Signed)
Called staffing for sitter, staffing said they had no one to send.

## 2019-08-05 NOTE — ED Notes (Signed)
Pt continuing to rip off the cords from the monitor. Will attempt to to reapply after pt calms down.

## 2019-08-05 NOTE — ED Notes (Addendum)
PT remains verbally aggressive and shouting. 10 mg Halodol given IM R deltoid per MD Jeraldine Loots.
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
## 2019-08-05 NOTE — ED Notes (Signed)
Pt offered sips of water

## 2019-08-05 NOTE — ED Notes (Signed)
Taken over patient care. Pt continues to be aggressive towards staff. Delusional. States she has just had a baby and is here to get checked for that. Pt intermittently verbally and physically aggressive towards staff. For example, agrees to care collecting blood, while setting up refuses. Pt continues to yelling, Kicking and swinging arms/legs even in violent restraints. Padding placed on rails for pt safety. Unable to dress pt into purple scrubs d/t safety for pt. And staff.

## 2019-08-05 NOTE — Telephone Encounter (Signed)
Called patient to schedule 1 week incision and blood pressure check per in-basket message.  Informed patient that I was calling to schedule her to come in the office this week for incision and blood pressure check.  Pt was very disoriented and wasn't making sense during our conversation.  Asked patient if she could come to the office Wednesday, 08/07/2019 at 11:15am.  Pt said that she could not do that and stated that "I have to go way back."  Pt kept telling me to hold on and eventually she hung up the phone.

## 2019-08-05 NOTE — ED Notes (Signed)
Attempted to Covid swabs, give meds, and vitals. PT kicking/bucking and spitting.

## 2019-08-06 ENCOUNTER — Other Ambulatory Visit: Payer: Self-pay

## 2019-08-06 ENCOUNTER — Telehealth: Payer: Self-pay | Admitting: General Practice

## 2019-08-06 LAB — CBC WITH DIFFERENTIAL/PLATELET
Abs Immature Granulocytes: 0.03 10*3/uL (ref 0.00–0.07)
Basophils Absolute: 0 10*3/uL (ref 0.0–0.1)
Basophils Relative: 0 %
Eosinophils Absolute: 0.3 10*3/uL (ref 0.0–0.5)
Eosinophils Relative: 4 %
HCT: 33.2 % — ABNORMAL LOW (ref 36.0–46.0)
Hemoglobin: 11.2 g/dL — ABNORMAL LOW (ref 12.0–15.0)
Immature Granulocytes: 0 %
Lymphocytes Relative: 18 %
Lymphs Abs: 1.4 10*3/uL (ref 0.7–4.0)
MCH: 31.8 pg (ref 26.0–34.0)
MCHC: 33.7 g/dL (ref 30.0–36.0)
MCV: 94.3 fL (ref 80.0–100.0)
Monocytes Absolute: 0.7 10*3/uL (ref 0.1–1.0)
Monocytes Relative: 9 %
Neutro Abs: 5.4 10*3/uL (ref 1.7–7.7)
Neutrophils Relative %: 69 %
Platelets: 207 10*3/uL (ref 150–400)
RBC: 3.52 MIL/uL — ABNORMAL LOW (ref 3.87–5.11)
RDW: 12.9 % (ref 11.5–15.5)
WBC: 7.8 10*3/uL (ref 4.0–10.5)
nRBC: 0 % (ref 0.0–0.2)

## 2019-08-06 LAB — RAPID URINE DRUG SCREEN, HOSP PERFORMED
Amphetamines: NOT DETECTED
Barbiturates: NOT DETECTED
Benzodiazepines: POSITIVE — AB
Cocaine: NOT DETECTED
Opiates: NOT DETECTED
Tetrahydrocannabinol: NOT DETECTED

## 2019-08-06 LAB — COMPREHENSIVE METABOLIC PANEL
ALT: 31 U/L (ref 0–44)
AST: 72 U/L — ABNORMAL HIGH (ref 15–41)
Albumin: 2.8 g/dL — ABNORMAL LOW (ref 3.5–5.0)
Alkaline Phosphatase: 85 U/L (ref 38–126)
Anion gap: 12 (ref 5–15)
BUN: 15 mg/dL (ref 6–20)
CO2: 21 mmol/L — ABNORMAL LOW (ref 22–32)
Calcium: 8.5 mg/dL — ABNORMAL LOW (ref 8.9–10.3)
Chloride: 107 mmol/L (ref 98–111)
Creatinine, Ser: 0.76 mg/dL (ref 0.44–1.00)
GFR calc Af Amer: >60 mL/min (ref >60)
GFR calc non Af Amer: >60 mL/min (ref >60)
Glucose, Bld: 91 mg/dL (ref 70–99)
Potassium: 3.5 mmol/L (ref 3.5–5.1)
Sodium: 140 mmol/L (ref 135–145)
Total Bilirubin: 0.5 mg/dL (ref 0.3–1.2)
Total Protein: 5.9 g/dL — ABNORMAL LOW (ref 6.5–8.1)

## 2019-08-06 LAB — URINALYSIS, ROUTINE W REFLEX MICROSCOPIC
Bilirubin Urine: NEGATIVE
Glucose, UA: NEGATIVE mg/dL
Ketones, ur: 20 mg/dL — AB
Leukocytes,Ua: NEGATIVE
Nitrite: NEGATIVE
Protein, ur: 30 mg/dL — AB
Specific Gravity, Urine: 1.021 (ref 1.005–1.030)
pH: 6 (ref 5.0–8.0)

## 2019-08-06 LAB — ETHANOL: Alcohol, Ethyl (B): <10 mg/dL (ref <10)

## 2019-08-06 LAB — I-STAT BETA HCG BLOOD, ED (MC, WL, AP ONLY): I-stat hCG, quantitative: 105.6 m[IU]/mL — ABNORMAL HIGH (ref <5)

## 2019-08-06 LAB — SARS CORONAVIRUS 2 BY RT PCR (HOSPITAL ORDER, PERFORMED IN ~~LOC~~ HOSPITAL LAB): SARS Coronavirus 2: NEGATIVE

## 2019-08-06 MED ORDER — OLANZAPINE 10 MG IM SOLR
10.0000 mg | Freq: Once | INTRAMUSCULAR | Status: AC
  Administered 2019-08-06: 10 mg via INTRAMUSCULAR
  Filled 2019-08-06: qty 10

## 2019-08-06 MED ORDER — STERILE WATER FOR INJECTION IJ SOLN
INTRAMUSCULAR | Status: AC
  Administered 2019-08-06: 10 mL
  Filled 2019-08-06: qty 10

## 2019-08-06 NOTE — ED Notes (Signed)
Lunch Tray Ordered @ 1040. 

## 2019-08-06 NOTE — ED Notes (Signed)
Pt is now awake - eating dinner. Telepsych cart placed at bedside for TTS.
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
## 2019-08-06 NOTE — Telephone Encounter (Signed)
Left message for patient to give our office a call back to confirm appt for tomorrow 08/06/2019 at 1:00pm.  Pt is scheduled to have incision and blood pressure checked.

## 2019-08-06 NOTE — ED Notes (Signed)
Pt on phone at nurses' desk. 

## 2019-08-06 NOTE — ED Notes (Addendum)
Pt arrived to Rm 48 then moved to room 50 via bed - Pt wearing burgundy scrubs - Blood from vaginal area noted - Pt refusing to wear pad per report. Sitter w/pt. Pt noted to be sleeping. Lunch at bedside. Urine specimen cup at bedside.

## 2019-08-06 NOTE — ED Notes (Signed)
Pt unable to stay awake, able to take her medicines, but fell back asleep shortly after.

## 2019-08-06 NOTE — BH Assessment (Signed)
Clinician contacted pt's nurse, Tyrone Sage RN, to determine if pt was able to participate in her [facility] Assessment. Pt's nurse stated that pt is currently under the influence of the medications she was given to assist her in calming down so she would not be able to participate at this time. TTS will attempt to see pt at a later time.

## 2019-08-06 NOTE — ED Notes (Addendum)
Pt making 2nd phone call - talking w/Gail". Pt noted to be calm at this time.

## 2019-08-06 NOTE — ED Provider Notes (Signed)
2:55 AM  Pt with increasingly aggressive behavior.  History of schizophrenia.  Patient is postpartum.  Has received Ativan, Haldol and Geodon with relief for a couple of hours.  Will give IM Zyprexa.  Patient is screaming, fighting against her restraints.  Pt is under IVC.  Danger to herself and others.   CRITICAL CARE Performed by: Rochele Raring   Total critical care time: 35 minutes  Critical care time was exclusive of separately billable procedures and treating other patients.  Critical care was necessary to treat or prevent imminent or life-threatening deterioration.  Critical care was time spent personally by me on the following activities: development of treatment plan with patient and/or surrogate as well as nursing, discussions with consultants, evaluation of patient's response to treatment, examination of patient, obtaining history from patient or surrogate, ordering and performing treatments and interventions, ordering and review of laboratory studies, ordering and review of radiographic studies, pulse oximetry and re-evaluation of patient's condition.    Brandy Sweeney, Layla Maw, DO 08/06/19 [phone]

## 2019-08-07 ENCOUNTER — Encounter (HOSPITAL_COMMUNITY): Payer: Self-pay | Admitting: *Deleted

## 2019-08-07 ENCOUNTER — Telehealth: Payer: Self-pay | Admitting: General Practice

## 2019-08-07 ENCOUNTER — Ambulatory Visit: Payer: Medicaid Other | Admitting: Medical

## 2019-08-07 NOTE — ED Notes (Signed)
Pt given sandwich bag and drink. Cooperative at this time.

## 2019-08-07 NOTE — Progress Notes (Signed)
Received report from RN on pt coming to [facility] 507-1.

## 2019-08-07 NOTE — BH Assessment (Signed)
Per Percell Boston, RN Director/AC the patient has been accepted to [facility], bed 507-1 ; Accepting provider is Dr. Landry Mellow, MD; Attending provider is Dr. Landry Mellow, MD.   Patient can arrive anytime after 8:00pm.   Number for report is [phone].    Anell Barr, RN notified. Reports she will notify EDP as well.     Baldo Daub, MSW, LCSW Clinical Social Worker [place] [facility]

## 2019-08-07 NOTE — Telephone Encounter (Signed)
Pt appointment cancelled for today due to patient hospitalized.

## 2019-08-07 NOTE — ED Provider Notes (Signed)
Emergency Medicine Observation Re-evaluation Note  Brandy Sweeney is a 29 y.o. female, seen on rounds today.  Pt initially presented to the ED for complaints of Psychiatric Evaluation and Aggressive Behavior Currently, the patient is awaiting psych reevaluation today as of 12:00pm.  Physical Exam  BP (!) 157/97 (BP Location: Right Arm)   Pulse 84   Temp 98.8 F (37.1 C) (Oral)   Resp 18   SpO2 97%  Physical Exam Vitals and nursing note reviewed.  Constitutional:      General: She is not in acute distress.    Appearance: She is well-developed. She is not diaphoretic.  HENT:     Head: Normocephalic and atraumatic.  Eyes:     Conjunctiva/sclera: Conjunctivae normal.  Cardiovascular:     Rate and Rhythm: Normal rate and regular rhythm.  Pulmonary:     Effort: Pulmonary effort is normal.  Musculoskeletal:     Cervical back: Neck supple.  Skin:    General: Skin is warm and dry.     Coloration: Skin is not pale.  Neurological:     Mental Status: She is alert and oriented to person, place, and time.  Psychiatric:        Mood and Affect: Affect is labile.        Behavior: Behavior normal.     ED Course / MDM  EKG:      Abnormal Labs Reviewed  COMPREHENSIVE METABOLIC PANEL - Abnormal; Notable for the following components:      Result Value   CO2 21 (*)    Calcium 8.5 (*)    Total Protein 5.9 (*)    Albumin 2.8 (*)    AST 72 (*)    All other components within normal limits  CBC WITH DIFFERENTIAL/PLATELET - Abnormal; Notable for the following components:   RBC 3.52 (*)    Hemoglobin 11.2 (*)    HCT 33.2 (*)    All other components within normal limits  URINALYSIS, ROUTINE W REFLEX MICROSCOPIC - Abnormal; Notable for the following components:   Hgb urine dipstick LARGE (*)    Ketones, ur 20 (*)    Protein, ur 30 (*)    Bacteria, UA RARE (*)    All other components within normal limits  RAPID URINE DRUG SCREEN, HOSP PERFORMED - Abnormal; Notable for the following  components:   Benzodiazepines POSITIVE (*)    All other components within normal limits  I-STAT BETA HCG BLOOD, ED ([facility], [facility], [facility] ONLY) - Abnormal; Notable for the following components:   I-stat hCG, quantitative 105.6 (*)    All other components within normal limits   Hemoglobin  Date Value Ref Range Status  08/06/2019 11.2 (L) 12.0 - 15.0 g/dL Final  70/35/0093 81.8 (L) 12.0 - 15.0 g/dL Final  29/93/7169 67.8 (L) 12.0 - 15.0 g/dL Final  93/81/0175 10.2 (L) 12.0 - 15.0 g/dL Final     I have reviewed the labs performed to date as well as medications administered while in observation.  Recent changes in the last 24 hours include a violent outburst last night upon arrival requiring medication sedation. Plan  Current plan is for reevaluation by psychiatric team today. Patient is under full IVC at this time.  Clinical Course as of Aug 06 1952  Wed Aug 07, 2019  1214 Noted to have been at this level for many of her previous lab results.  Calcium(!): 8.5 [SJ]  1311 Recent pregnancy. Patient just delivered on July 8.  I-stat hCG, quantitative(!): 105.6 [SJ]  Clinical Course User Index [SJ] Anselm Pancoast, PA-C   Home medications have been ordered. Patient recommended for inpatient management by psych team. She has been accepted at behavioral health and can be sent there after 8 PM tonight..   Vitals:   08/06/19 1103 08/06/19 1850 08/06/19 2100 08/07/19 0701  BP: (!) 161/99 140/83 (!) 149/82 (!) 157/97  Pulse: 77 95 90 84  Resp: (!) 22 18 18 18   Temp:  99 F (37.2 C) 98.9 F (37.2 C) 98.8 F (37.1 C)  TempSrc:  Oral Oral Oral  SpO2: 94% 99% 99% 97%          08/07/19 1955    08/09/19, MD 08/08/19 [phone]

## 2019-08-07 NOTE — ED Notes (Signed)
[facility] called for transport.

## 2019-08-07 NOTE — ED Notes (Signed)
Pt has been cooperative, pleasant. Is concerned about [facility] and being IVC'd. Is on the phone currently with family.

## 2019-08-07 NOTE — ED Notes (Signed)
Pt asking when she is going home, and why she is IVCd. WOuld like to speak with dr.

## 2019-08-08 ENCOUNTER — Inpatient Hospital Stay (HOSPITAL_COMMUNITY)
Admission: EM | Admit: 2019-08-08 | Discharge: 2019-08-10 | DRG: 776 | Disposition: A | Discharge status: Home / Self Care | Payer: Medicaid Other | Source: Other Acute Inpatient Hospital | Attending: Psychiatry | Admitting: Psychiatry

## 2019-08-08 ENCOUNTER — Other Ambulatory Visit: Payer: Self-pay

## 2019-08-08 ENCOUNTER — Encounter (HOSPITAL_COMMUNITY): Payer: Self-pay | Admitting: Psychiatry

## 2019-08-08 DIAGNOSIS — O99345 Other mental disorders complicating the puerperium: Secondary | ICD-10-CM | POA: Diagnosis present

## 2019-08-08 DIAGNOSIS — O99335 Smoking (tobacco) complicating the puerperium: Secondary | ICD-10-CM | POA: Diagnosis present

## 2019-08-08 DIAGNOSIS — F250 Schizoaffective disorder, bipolar type: Secondary | ICD-10-CM | POA: Diagnosis present

## 2019-08-08 DIAGNOSIS — F17210 Nicotine dependence, cigarettes, uncomplicated: Secondary | ICD-10-CM | POA: Diagnosis present

## 2019-08-08 MED ORDER — BENZTROPINE MESYLATE 1 MG PO TABS: 1.0000 mg | ORAL_TABLET | Freq: Two times a day (BID) | ORAL | Status: DC

## 2019-08-08 MED ORDER — HYDROXYZINE HCL 25 MG PO TABS: 25.0000 mg | ORAL_TABLET | Freq: Three times a day (TID) | ORAL | Status: DC | PRN

## 2019-08-08 MED ORDER — PALIPERIDONE ER 6 MG PO TB24
6.0000 mg | ORAL_TABLET | Freq: Every day | ORAL | Status: DC
  Administered 2019-08-08 – 2019-08-09 (×2): 6 mg via ORAL
  Filled 2019-08-08 (×4): qty 1

## 2019-08-08 MED ORDER — MAGNESIUM HYDROXIDE 400 MG/5ML PO SUSP: 30.0000 mL | Freq: Every day | ORAL | Status: DC | PRN

## 2019-08-08 MED ORDER — ARIPIPRAZOLE 10 MG PO TABS: 10.0000 mg | ORAL_TABLET | Freq: Every day | ORAL | Status: DC

## 2019-08-08 MED ORDER — ACETAMINOPHEN 325 MG PO TABS: 650.0000 mg | ORAL_TABLET | Freq: Four times a day (QID) | ORAL | Status: DC | PRN

## 2019-08-08 MED ORDER — OMEGA-3-ACID ETHYL ESTERS 1 G PO CAPS
1.0000 g | ORAL_CAPSULE | Freq: Two times a day (BID) | ORAL | Status: DC
  Administered 2019-08-08 – 2019-08-10 (×4): 1 g via ORAL
  Filled 2019-08-08 (×8): qty 1

## 2019-08-08 MED ORDER — WHITE PETROLATUM EX OINT
TOPICAL_OINTMENT | CUTANEOUS | Status: AC
  Filled 2019-08-08: qty 5

## 2019-08-08 MED ORDER — PALIPERIDONE ER 3 MG PO TB24
3.0000 mg | ORAL_TABLET | ORAL | Status: AC
  Administered 2019-08-08: 3 mg via ORAL
  Filled 2019-08-08: qty 1

## 2019-08-08 MED ORDER — NICOTINE 14 MG/24HR TD PT24
14.0000 mg | MEDICATED_PATCH | Freq: Every day | TRANSDERMAL | Status: DC
  Filled 2019-08-08 (×5): qty 1

## 2019-08-08 MED ORDER — FLUOXETINE HCL 10 MG PO CAPS
10.0000 mg | ORAL_CAPSULE | Freq: Every day | ORAL | Status: DC
  Administered 2019-08-08 – 2019-08-10 (×3): 10 mg via ORAL
  Filled 2019-08-08 (×5): qty 1

## 2019-08-08 MED ORDER — BUPROPION HCL ER (XL) 150 MG PO TB24: 150.0000 mg | ORAL_TABLET | Freq: Every day | ORAL | Status: DC

## 2019-08-08 MED ORDER — ZOLPIDEM TARTRATE 5 MG PO TABS
5.0000 mg | ORAL_TABLET | Freq: Every evening | ORAL | Status: DC | PRN
  Filled 2019-08-08: qty 1

## 2019-08-08 MED ORDER — NIFEDIPINE ER 30 MG PO TB24
30.0000 mg | ORAL_TABLET | Freq: Every day | ORAL | Status: DC
  Administered 2019-08-08: 30 mg via ORAL
  Filled 2019-08-08 (×3): qty 1

## 2019-08-08 MED ORDER — ENSURE ENLIVE PO LIQD: 237.0000 mL | Freq: Two times a day (BID) | ORAL | Status: DC

## 2019-08-08 MED ORDER — DIVALPROEX SODIUM 250 MG PO DR TAB
750.0000 mg | DELAYED_RELEASE_TABLET | Freq: Two times a day (BID) | ORAL | Status: DC
  Administered 2019-08-08 – 2019-08-10 (×5): 750 mg via ORAL
  Filled 2019-08-08 (×9): qty 3

## 2019-08-08 MED ORDER — PALIPERIDONE PALMITATE ER 234 MG/1.5ML IM SUSY
234.0000 mg | PREFILLED_SYRINGE | Freq: Once | INTRAMUSCULAR | Status: AC
  Administered 2019-08-09: 234 mg via INTRAMUSCULAR
  Filled 2019-08-08: qty 1.5

## 2019-08-08 MED ORDER — ALUM & MAG HYDROXIDE-SIMETH 200-200-20 MG/5ML PO SUSP: 30.0000 mL | ORAL | Status: DC | PRN

## 2019-08-08 NOTE — Progress Notes (Signed)
Pt here from [facility]. Pt cannot be registered and admitted to [facility] without being discharged from the ED. Called [facility] and was told by an RN that pt would be discharged after she finished her current task.
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
## 2019-08-08 NOTE — Progress Notes (Signed)
   08/08/19 0120  Psych Admission Type (Psych Patients Only)  Admission Status Involuntary  Psychosocial Assessment  Patient Complaints Anxiety  Eye Contact Fair  Facial Expression Anxious  Affect Anxious  Speech Logical/coherent  Interaction Assertive  Motor Activity Slow  Appearance/Hygiene Disheveled;In scrubs  Behavior Characteristics Cooperative;Anxious  Mood Anxious;Pleasant  Aggressive Behavior  Effect No apparent injury  Thought Process  Coherency WDL  Content WDL  Delusions None reported or observed  Perception WDL  Hallucination None reported or observed  Judgment Poor  Confusion None  Danger to Self  Current suicidal ideation? Denies  Danger to Others  Danger to Others None reported or observed   Pt states that she wants to know how long she will be here. Pt states that she lives alone but her older child and her newborn are in [place]. Pt restarted on meds that she was taking prior to pregnancy: Prozac, Depakote, Invega, Procardia. Pt states HTN was a factor while she was pregnant. Pt states that she has no side effects from restarting meds. Pt does endorse a 15 pound weight loss recently d/t a decreased appetite. Pt endorses quiet time as a way to calm down when she is angry or upset.

## 2019-08-08 NOTE — Progress Notes (Signed)
   08/08/19 2300  Psych Admission Type (Psych Patients Only)  Admission Status Involuntary  Psychosocial Assessment  Patient Complaints Anxiety  Eye Contact Fair  Facial Expression Anxious  Affect Anxious  Speech Logical/coherent  Interaction Assertive  Motor Activity Slow  Appearance/Hygiene Disheveled;In scrubs  Behavior Characteristics Anxious  Mood Anxious  Aggressive Behavior  Effect No apparent injury  Thought Process  Coherency WDL  Content WDL  Delusions None reported or observed  Perception WDL  Hallucination None reported or observed  Judgment Poor  Confusion None  Danger to Self  Current suicidal ideation? Denies  Danger to Others  Danger to Others None reported or observed

## 2019-08-08 NOTE — Tx Team (Signed)
Initial Treatment Plan 08/08/2019 1:55 AM Braulio Conte OIN:[id number]    PATIENT STRESSORS: Marital or family conflict Other: being here under IVC   PATIENT STRENGTHS: Average or above average intelligence Supportive family/friends   PATIENT IDENTIFIED PROBLEMS: Agitation and physical aggression  Anxiety  Depression  (pt wants to work on discharge and resuming pre-pregnancy meds)               DISCHARGE CRITERIA:  Adequate post-discharge living arrangements Improved stabilization in mood, thinking, and/or behavior Verbal commitment to aftercare and medication compliance  PRELIMINARY DISCHARGE PLAN: Attend aftercare/continuing care group Attend PHP/IOP Outpatient therapy Return to previous living arrangement  PATIENT/FAMILY INVOLVEMENT: This treatment plan has been presented to and reviewed with the patient, Laurette Villescas, and/or family member.  The patient and family have been given the opportunity to ask questions and make suggestions.  Victorino December, RN 08/08/2019, 1:55 AM

## 2019-08-08 NOTE — Progress Notes (Signed)
NUTRITION ASSESSMENT RD working remotely.   Pt identified as at risk on the Malnutrition Screen Tool  INTERVENTION: - continue Ensure Enlive BID, each supplement provides 350 kcal and 20 grams of protein  NUTRITION DIAGNOSIS: None  Goal: Pt to meet >/= 90% of their estimated nutrition needs.  Monitor:  PO intake  Assessment:  Patient with hx of bipolar disorder, schizoaffective disorder. She was IVC'd due to agitation and physical aggression toward her sister PTA.  Notes indicate that patient stopped taking psych meds when she found out in 12/2018 that she was pregnant. Notes state that she delivered her baby on 08/01/19.   Per chart review, weight today is 230 lb (appears to be a stated weight) and that weight on 7/7 was 254 lb. MST report shows that patient reported 15 lb weight loss; no time frame listed. Would suspect weight loss for patient is is one week post-partum.   Ensure Enlive was ordered BID at the time of hospitalization.    29 y.o. female  Height: Ht Readings from Last 1 Encounters:  08/08/19 5\' 6"  (1.676 m)    Weight: Wt Readings from Last 1 Encounters:  08/08/19 104.3 kg    Weight Hx: Wt Readings from Last 10 Encounters:  08/08/19 104.3 kg  07/31/19 115.7 kg  05/14/19 120 kg  12/21/13 107.5 kg    BMI:  Body mass index is 37.12 kg/m. Pt meets criteria for obesity based on current BMI.  Estimated Nutritional Needs: Kcal: 25-30 kcal/kg Protein: > 1 gram protein/kg Fluid: 1 ml/kcal  Diet Order:  Diet Order            Diet regular Room service appropriate? Yes; Fluid consistency: Thin  Diet effective now                Pt is also offered choice of unit snacks mid-morning and mid-afternoon.  Pt is eating as desired.   Lab results and medications reviewed.     12/23/13, MS, RD, LDN, CNSC Inpatient Clinical Dietitian RD pager # available in [facility]  After hours/weekend pager # available in [facility]

## 2019-08-08 NOTE — H&P (Signed)
Psychiatric Admission Assessment Adult  Patient Identification: Braulio Contemily Triplett MRN:  [id number] Date of Evaluation:  08/08/2019 Chief Complaint:  Schizoaffective disorder, bipolar type (HCC) [F25.0] Principal Diagnosis: <principal problem not specified> Diagnosis:  Active Problems:   Schizoaffective disorder, bipolar type (HCC)  History of Present Illness: Patient is seen and examined.  Patient is a 29 year old female with a reported past psychiatric history significant for schizoaffective disorder; bipolar type versus bipolar disorder who originally presented [facility] obst[facility] department on 07/31/2019.  She was at 39 weeks and 6 days into her pregnancy, and presented for contractions and found to have severe preeclampsia.  She had reported decreased fetal movement at that time.  She was admitted to the hospital.  She was started on Pitocin, and [facility].  There was an anticipated VBAC as well.  The plan change during the course of the hospitalization to repeat C-section.  This was done on 08/01/2019.  Social work for the patient was contacted by the patient's adoptive mother.  There was reported a longstanding history of psychiatric illness, and there is also a child protective service case in [place] and in [place].  Psychiatric consultation was obtained on 08/02/2019.  The consultation evaluated the patient and both agreed to restart oral Invega, Depakote and fluoxetine given her history.  She was discharged from the obstetrical service on 08/03/2019.  She was given prescriptions for the Invega, Depakote and fluoxetine.  On 08/05/2019 the patient had been involuntarily committed by the patient's sister because of becoming verbally and physically aggressive with the sister.  She was [facility] emergency department at that time.  Because of her aggressive behavior she received 20 mg of Geodon in the  department.  During the second psychiatric evaluation the patient admitted that she had become noncompliant with medications during the pregnancy.  During that interview she was thought to be pleasant, cooperative.  She remained in the emergency department.  The sister reported to staff that the patient had told her that she had delivered a stillborn.  The patient's sister is the patient's healthcare power of attorney.  The patient had apparently been seen by [facility], and the desire was to return into their treatment.  The sister and the patient's mother believe that the patient was not stable enough to care for the child at this point.  She was reportedly restarted on her medications while sitting in the emergency department.  When a bed became available on the 500 on psychiatry she was transferred to our facility for evaluation and stabilization.  The patient basically reported the superficial history of what it occurred.  She stated that she did not want to get back on her medications.  She stated she had been back on them for the last 3 days in the emergency department.  She denied any auditory or visual hallucinations.  She denied any suicidal or homicidal ideation.  She stated that she felt as though she was ready to go home.  We discussed the fact that we needed to contact her mother's sister for collateral information.  She was not really happy with that, but agreed to stay on her medications and to allow us to get back collateral information.  Her last psychiatric hospitalization at our facility it appears was in 2015.  The patient admitted to 4 or 5 previous psychiatric admissions.  Her discharge medications at that time included Cogentin, Depakote, Risperdal,  long-acting Risperdal injection and Ambien.  She also has previously been treated with fluoxetine, Wellbutrin XL and Abilify.  Associated Signs/Symptoms: Depression Symptoms:  depressed mood, anhedonia, insomnia, psychomotor  agitation, fatigue, feelings of worthlessness/guilt, difficulty concentrating, hopelessness, suicidal thoughts without plan, anxiety, (Hypo) Manic Symptoms:  Impulsivity, Irritable Mood, Labiality of Mood, Anxiety Symptoms:  Excessive Worry, Psychotic Symptoms:  Delusions, Paranoia, PTSD Symptoms: Negative Total Time spent with patient: 45 minutes  Past Psychiatric History: Patient admitted to multiple previous psychiatric hospitalizations.  Her last psychiatric hospitalization at our facility was in 2015.  Her diagnosis at that time included bipolar disorder, schizoaffective disorder; bipolar type and postpartum depression.  She was seen in the emergency department at [facility] emergency department in December 2020.  The report at that time had been that she been off her medications for approximately 3 months.  She had been placed under involuntary commitment by her sister.  Her medications were restarted including a long-acting Abilify injection.  She apparently had received this 1 to 2 weeks prior to her evaluation in the emergency department and it does not appear as though she had been admitted.  She has been followed at [facility] on and off for some period of time.  She also has been followed by [facility] in the past.  Is the patient at risk to self? Yes.    Has the patient been a risk to self in the past 6 months? Yes.    Has the patient been a risk to self within the distant past? Yes.    Is the patient a risk to others? Yes.    Has the patient been a risk to others in the past 6 months? Yes.    Has the patient been a risk to others within the distant past? No.   Prior Inpatient Therapy:   Prior Outpatient Therapy:    Alcohol Screening: 1. How often do you have a drink containing alcohol?: Never 2. How many drinks containing alcohol do you have on a typical day when you are drinking?: 1 or 2 3. How often do you have six or more drinks on one occasion?: Never AUDIT-C Score: 0 9.  Have you or someone else been injured as a result of your drinking?: No 10. Has a relative or friend or a doctor or another health worker been concerned about your drinking or suggested you cut down?: No Alcohol Use Disorder Identification Test Final Score (AUDIT): 0 Alcohol Brief Interventions/Follow-up: AUDIT Score <7 follow-up not indicated Substance Abuse History in the last 12 months:  No. Consequences of Substance Abuse: Negative Previous Psychotropic Medications: Yes  Psychological Evaluations: Yes  Past Medical History:  Past Medical History:  Diagnosis Date  . Postpartum depression   . Pregnant   . PTSD (post-traumatic stress disorder)   . Schizo affective schizophrenia [facility])     Past Surgical History:  Procedure Laterality Date  . CESAREAN SECTION    . CESAREAN SECTION N/A 08/01/2019   Procedure: CESAREAN SECTION;  Surgeon: Lazaro Arms, MD;  Location: [facility] LD ORS;  Service: Obstetrics;  Laterality: N/A;  . HIP SURGERY     Family History: History reviewed. No pertinent family history. Family Psychiatric  History: Noncontributory Tobacco Screening: Have you used any form of tobacco in the last 30 days? (Cigarettes, Smokeless Tobacco, Cigars, and/or Pipes): Yes Tobacco use, Select all that apply: 5 or more cigarettes per day Are you interested in Tobacco Cessation Medications?: Yes, will notify MD for an order Counseled patient  on smoking cessation including recognizing danger situations, developing coping skills and basic information about quitting provided: Refused/Declined practical counseling Social History:  Social History   Substance and Sexual Activity  Alcohol Use No     Social History   Substance and Sexual Activity  Drug Use Not Currently    Additional Social History:                           Allergies:   Allergies  Allergen Reactions  . Trazodone And Nefazodone Hives    Also itching   . Penicillins Rash   Lab Results:  Results for  orders placed or performed during the hospital encounter of 08/05/19 (from the past 48 hour(s))  Urinalysis, Routine w reflex microscopic     Status: Abnormal   Collection Time: 08/06/19  6:24 PM  Result Value Ref Range   Color, Urine YELLOW YELLOW   APPearance CLEAR CLEAR   Specific Gravity, Urine 1.021 1.005 - 1.030   pH 6.0 5.0 - 8.0   Glucose, UA NEGATIVE NEGATIVE mg/dL   Hgb urine dipstick LARGE (A) NEGATIVE   Bilirubin Urine NEGATIVE NEGATIVE   Ketones, ur 20 (A) NEGATIVE mg/dL   Protein, ur 30 (A) NEGATIVE mg/dL   Nitrite NEGATIVE NEGATIVE   Leukocytes,Ua NEGATIVE NEGATIVE   RBC / HPF 21-50 0 - 5 RBC/hpf   WBC, UA 0-5 0 - 5 WBC/hpf   Bacteria, UA RARE (A) NONE SEEN   Squamous Epithelial / LPF 0-5 0 - 5   Mucus PRESENT    Hyaline Casts, UA PRESENT     Comment: Performed at [facility], [address], [place], [place] [address]  Urine rapid drug screen (hosp performed)     Status: Abnormal   Collection Time: 08/06/19  6:24 PM  Result Value Ref Range   Opiates NONE DETECTED NONE DETECTED   Cocaine NONE DETECTED NONE DETECTED   Benzodiazepines POSITIVE (A) NONE DETECTED   Amphetamines NONE DETECTED NONE DETECTED   Tetrahydrocannabinol NONE DETECTED NONE DETECTED   Barbiturates NONE DETECTED NONE DETECTED    Comment: (NOTE) DRUG SCREEN FOR MEDICAL PURPOSES ONLY.  IF CONFIRMATION IS NEEDED FOR ANY PURPOSE, NOTIFY LAB WITHIN 5 DAYS.  LOWEST DETECTABLE LIMITS FOR URINE DRUG SCREEN Drug Class                     Cutoff (ng/mL) Amphetamine and metabolites    1000 Barbiturate and metabolites    200 Benzodiazepine                 200 Tricyclics and metabolites     300 Opiates and metabolites        300 Cocaine and metabolites        300 THC                            50 Performed at [facility], [address] [place], [place] [address]     Blood Alcohol level:  Lab Results  Component Value Date   [facility] <10 08/06/2019   ETH <10 01/13/2019     Metabolic Disorder Labs:  Lab Results  Component Value Date   HGBA1C 5.7 (H) 12/24/2013   MPG 117 (H) 12/24/2013   No results found for: PROLACTIN Lab Results  Component Value Date   CHOL 178 12/24/2013   TRIG 155 (H) 12/24/2013   HDL 30 (  L) 12/24/2013   CHOLHDL 5.9 12/24/2013   VLDL 31 12/24/2013   LDLCALC 117 (H) 12/24/2013    Current Medications: Current Facility-Administered Medications  Medication Dose Route Frequency Provider Last Rate Last Admin  . acetaminophen (TYLENOL) tablet 650 mg  650 mg Oral Q6H PRN Anike, Adaku C, NP      . alum & mag hydroxide-simeth (MAALOX/MYLANTA) 200-200-20 MG/5ML suspension 30 mL  30 mL Oral Q4H PRN Anike, Adaku C, NP      . divalproex (DEPAKOTE) DR tablet 750 mg  750 mg Oral BID Antonieta Pert, MD   750 mg at 08/08/19 1130  . feeding supplement (ENSURE ENLIVE) (ENSURE ENLIVE) liquid 237 mL  237 mL Oral BID BM Anike, Adaku C, NP      . FLUoxetine (PROZAC) capsule 10 mg  10 mg Oral Daily Antonieta Pert, MD   10 mg at 08/08/19 1130  . hydrOXYzine (ATARAX/VISTARIL) tablet 25 mg  25 mg Oral TID PRN Anike, Adaku C, NP      . magnesium hydroxide (MILK OF MAGNESIA) suspension 30 mL  30 mL Oral Daily PRN Anike, Adaku C, NP      . nicotine (NICODERM CQ - dosed in mg/24 hours) patch 14 mg  14 mg Transdermal Daily Anike, Adaku C, NP      . NIFEdipine (ADALAT CC) 24 hr tablet 30 mg  30 mg Oral Daily Antonieta Pert, MD   30 mg at 08/08/19 1132  . omega-3 acid ethyl esters (LOVAZA) capsule 1 g  1 g Oral BID Antonieta Pert, MD   1 g at 08/08/19 1131  . paliperidone (INVEGA) 24 hr tablet 6 mg  6 mg Oral QHS Antonieta Pert, MD      . zolpidem [facility]) tablet 5 mg  5 mg Oral QHS PRN Antonieta Pert, MD       PTA Medications: Medications Prior to Admission  Medication Sig Dispense Refill Last Dose  . acetaminophen (TYLENOL) 500 MG tablet Take 500 mg by mouth every 6 (six) hours as needed for mild pain.     . divalproex (DEPAKOTE  SPRINKLE) 125 MG capsule Take 125 mg by mouth 2 (two) times daily.     . divalproex (DEPAKOTE) 125 MG DR tablet Take 1 tablet (125 mg total) by mouth 2 (two) times daily. 60 tablet 2   . FLUoxetine (PROZAC) 10 MG capsule Take 1 capsule (10 mg total) by mouth daily. 30 capsule 3   . ibuprofen (ADVIL) 800 MG tablet Take 1 tablet (800 mg total) by mouth 3 (three) times daily with meals as needed for headache, mild pain, moderate pain or cramping. 30 tablet 0   . metroNIDAZOLE (FLAGYL) 250 MG tablet Take 1,000 mg by mouth once.     Marland Kitchen NIFEdipine (ADALAT CC) 30 MG 24 hr tablet Take 1 tablet (30 mg total) by mouth daily. 30 tablet 0   . oxyCODONE (OXY IR/ROXICODONE) 5 MG immediate release tablet Take 1 tablet (5 mg total) by mouth every 6 (six) hours as needed for severe pain or breakthrough pain. 30 tablet 0   . paliperidone (INVEGA) 3 MG 24 hr tablet Take 1 tablet (3 mg total) by mouth daily. 30 tablet 2   . senna-docusate (SENOKOT-S) 8.6-50 MG tablet Take 2 tablets by mouth at bedtime as needed for mild constipation or moderate constipation. 30 tablet 2     Musculoskeletal: Strength & Muscle Tone: within normal limits Gait & Station: normal Patient leans: N/A  Psychiatric Specialty Exam: Physical Exam Vitals and nursing note reviewed.  Constitutional:      Appearance: She is obese.  HENT:     Head: Normocephalic and atraumatic.  Pulmonary:     Effort: Pulmonary effort is normal.  Neurological:     General: No focal deficit present.     Mental Status: She is alert and oriented to person, place, and time.     Review of Systems  Blood pressure 129/90, pulse (!) 109, temperature 99.1 F (37.3 C), temperature source Oral, resp. rate 16, height  (1.676 m), weight 104.3 kg, SpO2 98 %, unknown if currently breastfeeding.Body mass index is 37.12 kg/m.  General Appearance: Disheveled  Eye Contact:  Fair  Speech:  Normal Rate  Volume:  Decreased  Mood:  Depressed and Dysphoric  Affect:   Flat  Thought Process:  Goal Directed and Descriptions of Associations: Circumstantial  Orientation:  Full (Time, Place, and Person)  Thought Content:  Negative  Suicidal Thoughts:  No  Homicidal Thoughts:  No  Memory:  Immediate;   Fair Recent;   Fair Remote;   Fair  Judgement:  Intact  Insight:  Lacking  Psychomotor Activity:  Decreased  Concentration:  Concentration: Fair and Attention Span: Fair  Recall:  [employer] of Knowledge:  Fair  Language:  Fair  Akathisia:  Negative  Handed:  Right  AIMS (if indicated):     Assets:  Desire for Improvement Resilience Social Support  ADL's:  Intact  Cognition:  WNL  Sleep:  Number of Hours: 3.75    Treatment Plan Summary: Daily contact with patient to assess and evaluate symptoms and progress in treatment, Medication management and Plan : Patient is seen and examined.  Patient is a 29 year old female with the above-stated past psychiatric history who was admitted to the hospital secondary to concern for bipolar disorder and aggressive behavior.  She will be admitted to the hospital.  She will be integrated in the milieu.  She will be encouraged to attend groups.  We will restart her Depakote, fluoxetine and Invega.  Her Depakote dosage when she was discharged from the emergency room on the first occasion was at approximately 150 mg p.o. twice daily.  Clearly she had been previously treated with 750 mg p.o. twice daily.  I am going to go on an increase that dosage to 750 mg p.o. twice daily today.  We will restart her fluoxetine at 10 mg p.o. daily, but this will be done cautiously to make sure that we do not flip her into a manic phase.  We will start her back on paliperidone, but I will increase that to 6 mg p.o. nightly.  I will give her 3 mg p.o. now x1.  With regard to her medical issues she was discharged on nifedipine long-acting 30 mg p.o. daily.  That will be continued.  I will also write for ibuprofen for pain given her recent  C-section.  She will also have available Tylenol.  She was previously given low vase a for neuro protection, and we will continue that for now.  We will have social work contact the sister and the mother for collateral information.  Review of her admission laboratories revealed an elevation of her AST at 72.  This was actually a recent elevation.  8 days ago was normal.  That may have been due to her preeclampsia.  We will repeat that.  Her CBC revealed a mild anemia with a hemoglobin of 11.2 and hematocrit  of 33.2.  These are slightly elevated from 6 days ago.  I am assuming that was probably postoperative.  Her glucose was 91.  Her beta-hCG on 7/13 was still elevated at 105.6, but the patient just delivered.  Her RPR was nonreactive.  Hepatitis B was nonreactive.  HIV was negative.  Her urinalysis from 7/13 showed a large degree of hemoglobin.  There is also proteinuria, but again that may have been due to her recent C-section and delivery.  Her drug screen on 7/13 was positive for benzodiazepines.  Currently her blood pressure stable at 129/90.  She is afebrile.  She only slept 3.75 hours last night.  I will go on and restart her Ambien at 5 mg p.o. nightly as well.  Observation Level/Precautions:  15 minute checks  Laboratory:  Chemistry Profile  Psychotherapy:    Medications:    Consultations:    Discharge Concerns:    Estimated LOS:  Other:     Physician Treatment Plan for Primary Diagnosis: <principal problem not specified> Long Term Goal(s): Improvement in symptoms so as ready for discharge  Short Term Goals: Ability to identify changes in lifestyle to reduce recurrence of condition will improve, Ability to verbalize feelings will improve, Ability to disclose and discuss suicidal ideas, Ability to demonstrate self-control will improve, Ability to identify and develop effective coping behaviors will improve, Ability to maintain clinical measurements within normal limits will improve and  Compliance with prescribed medications will improve  Physician Treatment Plan for Secondary Diagnosis: Active Problems:   Schizoaffective disorder, bipolar type (HCC)  Long Term Goal(s): Improvement in symptoms so as ready for discharge  Short Term Goals: Ability to identify changes in lifestyle to reduce recurrence of condition will improve, Ability to verbalize feelings will improve, Ability to disclose and discuss suicidal ideas, Ability to demonstrate self-control will improve, Ability to identify and develop effective coping behaviors will improve, Ability to maintain clinical measurements within normal limits will improve and Compliance with prescribed medications will improve  I certify that inpatient services furnished can reasonably be expected to improve the patient's condition.    Antonieta Pert, MD 7/15/20213:08 PM

## 2019-08-08 NOTE — Progress Notes (Signed)
Recreation Therapy Notes  INPATIENT RECREATION THERAPY ASSESSMENT  Patient Details Name: Brandy Sweeney MRN: [id number] DOB: Feb 06, 1990 Today's Date: 08/08/2019       Information Obtained From: Patient  Able to Participate in Assessment/Interview: Yes  Patient Presentation: Alert  Reason for Admission (Per Patient): Other (Comments) (Pt stated an evaluation with doctor.)  Patient Stressors: Other (Comment) (Pt just had a baby.)  Coping Skills:   Journal, Sports, TV, Music, Meditate, Art, Talk  Leisure Interests (2+):  Music - Listen  Frequency of Recreation/Participation: Other (Comment) (Daily)  Awareness of Community Resources:  Yes  Community Resources:  Park, [employer], Other (Comment) Building surveyor)  Current Use: Yes  If no, Barriers?:    Expressed Interest in [employer] Information: No  [employer] of Residence:  Engineer, technical sales  Patient Main Form of Transportation: Therapist, music (Pt stated she also uses Pharmacist, community)  Patient Strengths:  Therapist, nutritional; Honesty; Hard working  Patient Identified Areas of Improvement:  None  Patient Goal for Hospitalization:  "get help and go home as soon as possible"  Current SI (including self-harm):  No  Current HI:  No  Current AVH: No  Staff Intervention Plan: Group Attendance, Collaborate with Interdisciplinary Treatment Team  Consent to Intern Participation: N/A     Caroll Rancher, LRT/CTRS  Caroll Rancher A 08/08/2019, 11:39 AM

## 2019-08-08 NOTE — Progress Notes (Signed)
Recreation Therapy Notes  Date: 7.15.21 Time: 0920 Location: 500 Hall Dayroom  Group Topic: Communication, Team Building, Problem Solving  Goal Area(s) Addresses:  Patient will effectively work with peer towards shared goal.  Patient will identify skill used to make activity successful.  Patient will identify how skills used during activity can be used to reach post d/c goals.   Intervention: STEM Activity   Activity: In team's, using 10 plastic cups and Sweeney rubber band with 5 strings attached, patients were to sit cups up straight and stack them into Sweeney pyramid.  Education: Pharmacist, community, Building control surveyor.   Education Outcome: Acknowledges education/In group clarification offered/Needs additional education.   Clinical Observations/Feedback:  Pt did not attend group session.   Brandy Sweeney, LRT/CTRS         Brandy Sweeney, Brandy Sweeney 08/08/2019 11:20 AM
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
## 2019-08-08 NOTE — Progress Notes (Signed)
DAR NOTE: Patient presents with a flat affect and depressed mood.  Denies suicidal thought, auditory and visual hallucinations.  Rates depression at 3, hopelessness at 0, and anxiety at 4.  Maintained on routine safety checks.  Medications given as prescribed.  Support and encouragement offered as needed.  Patient visible in milieu watching TV.  Minimal interaction with staff.  Patient is safe on and off the unit.  Offered no complaint.
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
## 2019-08-08 NOTE — Progress Notes (Signed)
Psychoeducational Group Note  Date:  08/08/2019 Time:  2029  Group Topic/Focus:  Wrap-Up Group:   The focus of this group is to help patients review their daily goal of treatment and discuss progress on daily workbooks.  Participation Level: Did Not Attend  Participation Quality:  Not Applicable  Affect:  Not Applicable  Cognitive:  Not Applicable  Insight:  Not Applicable  Engagement in Group: Not Applicable  Additional Comments:  The patient did not attend group this evening.   Hazle Coca S 08/08/2019, 8:29 PM

## 2019-08-09 MED ORDER — TAB-A-VITE/IRON PO TABS
1.0000 | ORAL_TABLET | Freq: Every day | ORAL | Status: DC
  Administered 2019-08-09 – 2019-08-10 (×2): 1 via ORAL
  Filled 2019-08-09 (×4): qty 1

## 2019-08-09 MED ORDER — NIFEDIPINE ER 60 MG PO TB24
60.0000 mg | ORAL_TABLET | Freq: Every day | ORAL | Status: DC
  Administered 2019-08-09 – 2019-08-10 (×2): 60 mg via ORAL
  Filled 2019-08-09 (×4): qty 1

## 2019-08-09 NOTE — [facility] Suicide Risk Assessment (Signed)
[facility] INPATIENT:  Family/Significant Other Suicide Prevention Education    Suicide Prevention Education: Education Completed; Mother, Clydia Llano [phone]) and Sister, Damyiah Moxley [phone])  has been identified by the patient as the family member/significant other with whom the patient will be residing, and identified as the person(s) who will aid the patient in the event of a mental health crisis (suicidal ideations/suicide attempt).  With written consent from the patient, the family member/significant other has been provided the following suicide prevention education, prior to the and/or following the discharge of the patient.  The suicide prevention education provided includes the following:  Suicide risk factors  Suicide prevention and interventions  National Suicide Hotline telephone number  [facility] assessment telephone number  [facility] Emergency Assistance 911  [facility] and/or Residential Mobile Crisis Unit telephone number   Request made of family/significant other to:  Remove weapons (e.g., guns, rifles, knives), all items previously/currently identified as safety concern.    Remove drugs/medications (over-the-counter, prescriptions, illicit drugs), all items previously/currently identified as a safety concern.   The family member/significant other verbalizes understanding of the suicide prevention education information provided.  The family member/significant other agrees to remove the items of safety concern listed above.  CSW spoke with patients mother who stated that she was caring for this patients new born child and stated that since she was able to take the child a CPS case was not opened. Per patients mother this patient has a history of being non-compliant with medications and outpatient services. She also stated that this patient has an apartment through [address] that she is unable to upkeep.   CSW spoke with patients sister who  stated that she is this patients POA, but is not her legal guardian. Per patients sister she has been taking care of this patients 101 year old son and has legal guardianship over him. Per patients sister she has attempted to assist her with following through with outpatient care, but stated that this patient has not received her injection from [facility] since 08/2018. Patients sister does not want this patient to receive ACTT services from any of the ACT Teams in this area, however she stated she would like this patient to receive CST services through [facility].    Ruthann Cancer MSW, [employer] Clincal Social Worker  [facility]

## 2019-08-09 NOTE — Progress Notes (Signed)
°   08/09/19 2000  Psych Admission Type (Psych Patients Only)  Admission Status Involuntary  Psychosocial Assessment  Patient Complaints Depression  Eye Contact Fair  Facial Expression Anxious  Affect Anxious  Speech Logical/coherent  Interaction Assertive  Motor Activity Slow  Appearance/Hygiene Disheveled;In scrubs  Behavior Characteristics Appropriate to situation  Mood Depressed  Aggressive Behavior  Effect No apparent injury  Thought Process  Coherency WDL  Content WDL  Delusions None reported or observed  Perception WDL  Hallucination None reported or observed  Judgment Poor  Confusion None  Danger to Self  Current suicidal ideation? Denies  Danger to Others  Danger to Others None reported or observed

## 2019-08-09 NOTE — Tx Team (Signed)
Interdisciplinary Treatment and Diagnostic Plan Update  08/09/2019 Time of Session: 10:05am Maddison Kilner MRN: [id number]  Principal Diagnosis: <principal problem not specified>  Secondary Diagnoses: Active Problems:   Schizoaffective disorder, bipolar type (HCC)   Current Medications:  Current Facility-Administered Medications  Medication Dose Route Frequency Provider Last Rate Last Admin   acetaminophen (TYLENOL) tablet 650 mg  650 mg Oral Q6H PRN Anike, Adaku C, NP       alum & mag hydroxide-simeth (MAALOX/MYLANTA) 200-200-20 MG/5ML suspension 30 mL  30 mL Oral Q4H PRN Anike, Adaku C, NP       divalproex (DEPAKOTE) DR tablet 750 mg  750 mg Oral BID Antonieta Pert, MD   750 mg at 08/09/19 2353   feeding supplement (ENSURE ENLIVE) (ENSURE ENLIVE) liquid 237 mL  237 mL Oral BID BM Anike, Adaku C, NP       FLUoxetine (PROZAC) capsule 10 mg  10 mg Oral Daily Antonieta Pert, MD   10 mg at 08/09/19 0813   hydrOXYzine (ATARAX/VISTARIL) tablet 25 mg  25 mg Oral TID PRN Anike, Adaku C, NP       magnesium hydroxide (MILK OF MAGNESIA) suspension 30 mL  30 mL Oral Daily PRN Anike, Adaku C, NP       multivitamins with iron tablet 1 tablet  1 tablet Oral Daily Karsten Ro, MD   1 tablet at 08/09/19 0941   nicotine (NICODERM CQ - dosed in mg/24 hours) patch 14 mg  14 mg Transdermal Daily Anike, Adaku C, NP       NIFEdipine (ADALAT CC) 24 hr tablet 60 mg  60 mg Oral Daily Antonieta Pert, MD   60 mg at 08/09/19 6144   omega-3 acid ethyl esters (LOVAZA) capsule 1 g  1 g Oral BID Antonieta Pert, MD   1 g at 08/09/19 0813   paliperidone (INVEGA) 24 hr tablet 6 mg  6 mg Oral QHS Antonieta Pert, MD   6 mg at 08/08/19 2046   zolpidem (AMBIEN) tablet 5 mg  5 mg Oral QHS PRN Antonieta Pert, MD       PTA Medications: Medications Prior to Admission  Medication Sig Dispense Refill Last Dose   acetaminophen (TYLENOL) 500 MG tablet Take 500 mg by mouth every 6 (six) hours as  needed for mild pain.      divalproex (DEPAKOTE SPRINKLE) 125 MG capsule Take 125 mg by mouth 2 (two) times daily.      divalproex (DEPAKOTE) 125 MG DR tablet Take 1 tablet (125 mg total) by mouth 2 (two) times daily. 60 tablet 2    FLUoxetine (PROZAC) 10 MG capsule Take 1 capsule (10 mg total) by mouth daily. 30 capsule 3    ibuprofen (ADVIL) 800 MG tablet Take 1 tablet (800 mg total) by mouth 3 (three) times daily with meals as needed for headache, mild pain, moderate pain or cramping. 30 tablet 0    metroNIDAZOLE (FLAGYL) 250 MG tablet Take 1,000 mg by mouth once.      NIFEdipine (ADALAT CC) 30 MG 24 hr tablet Take 1 tablet (30 mg total) by mouth daily. 30 tablet 0    oxyCODONE (OXY IR/ROXICODONE) 5 MG immediate release tablet Take 1 tablet (5 mg total) by mouth every 6 (six) hours as needed for severe pain or breakthrough pain. 30 tablet 0    paliperidone (INVEGA) 3 MG 24 hr tablet Take 1 tablet (3 mg total) by mouth daily. 30 tablet 2  senna-docusate (SENOKOT-S) 8.6-50 MG tablet Take 2 tablets by mouth at bedtime as needed for mild constipation or moderate constipation. 30 tablet 2     Patient Stressors: Marital or family conflict Other: being here under IVC  Patient Strengths: Average or above average intelligence Supportive family/friends  Treatment Modalities: Medication Management, Group therapy, Case management,  1 to 1 session with clinician, Psychoeducation, Recreational therapy.   Physician Treatment Plan for Primary Diagnosis: <principal problem not specified> Long Term Goal(s): Improvement in symptoms so as ready for discharge Improvement in symptoms so as ready for discharge   Short Term Goals: Ability to identify changes in lifestyle to reduce recurrence of condition will improve Ability to verbalize feelings will improve Ability to disclose and discuss suicidal ideas Ability to demonstrate self-control will improve Ability to identify and develop effective  coping behaviors will improve Ability to maintain clinical measurements within normal limits will improve Compliance with prescribed medications will improve Ability to identify changes in lifestyle to reduce recurrence of condition will improve Ability to verbalize feelings will improve Ability to disclose and discuss suicidal ideas Ability to demonstrate self-control will improve Ability to identify and develop effective coping behaviors will improve Ability to maintain clinical measurements within normal limits will improve Compliance with prescribed medications will improve  Medication Management: Evaluate patient's response, side effects, and tolerance of medication regimen.  Therapeutic Interventions: 1 to 1 sessions, Unit Group sessions and Medication administration.  Evaluation of Outcomes: Progressing  Physician Treatment Plan for Secondary Diagnosis: Active Problems:   Schizoaffective disorder, bipolar type (HCC)  Long Term Goal(s): Improvement in symptoms so as ready for discharge Improvement in symptoms so as ready for discharge   Short Term Goals: Ability to identify changes in lifestyle to reduce recurrence of condition will improve Ability to verbalize feelings will improve Ability to disclose and discuss suicidal ideas Ability to demonstrate self-control will improve Ability to identify and develop effective coping behaviors will improve Ability to maintain clinical measurements within normal limits will improve Compliance with prescribed medications will improve Ability to identify changes in lifestyle to reduce recurrence of condition will improve Ability to verbalize feelings will improve Ability to disclose and discuss suicidal ideas Ability to demonstrate self-control will improve Ability to identify and develop effective coping behaviors will improve Ability to maintain clinical measurements within normal limits will improve Compliance with prescribed  medications will improve     Medication Management: Evaluate patient's response, side effects, and tolerance of medication regimen.  Therapeutic Interventions: 1 to 1 sessions, Unit Group sessions and Medication administration.  Evaluation of Outcomes: Progressing   RN Treatment Plan for Primary Diagnosis: <principal problem not specified> Long Term Goal(s): Knowledge of disease and therapeutic regimen to maintain health will improve  Short Term Goals: Ability to remain free from injury will improve, Ability to verbalize frustration and anger appropriately will improve, Ability to identify and develop effective coping behaviors will improve and Compliance with prescribed medications will improve  Medication Management: RN will administer medications as ordered by provider, will assess and evaluate patient's response and provide education to patient for prescribed medication. RN will report any adverse and/or side effects to prescribing provider.  Therapeutic Interventions: 1 on 1 counseling sessions, Psychoeducation, Medication administration, Evaluate responses to treatment, Monitor vital signs and CBGs as ordered, Perform/monitor CIWA, COWS, AIMS and Fall Risk screenings as ordered, Perform wound care treatments as ordered.  Evaluation of Outcomes: Progressing   LCSW Treatment Plan for Primary Diagnosis: <principal problem not specified> Long Term  Goal(s): Safe transition to appropriate next level of care at discharge, Engage patient in therapeutic group addressing interpersonal concerns.  Short Term Goals: Engage patient in aftercare planning with referrals and resources, Increase social support, Facilitate patient progression through stages of change regarding substance use diagnoses and concerns, Identify triggers associated with mental health/substance abuse issues and Increase skills for wellness and recovery  Therapeutic Interventions: Assess for all discharge needs, 1 to 1 time  with Social worker, Explore available resources and support systems, Assess for adequacy in community support network, Educate family and significant other(s) on suicide prevention, Complete Psychosocial Assessment, Interpersonal group therapy.  Evaluation of Outcomes: Progressing   Progress in Treatment: Attending groups: Yes. Participating in groups: Yes. Taking medication as prescribed: Yes. Toleration medication: Yes. Family/Significant other contact made: No, will contact:  if consent is given. Patient understands diagnosis: Yes. Discussing patient identified problems/goals with staff: Yes. Medical problems stabilized or resolved: Yes. Denies suicidal/homicidal ideation: Yes. Issues/concerns per patient self-inventory: No. Other: none  New problem(s) identified: No, Describe:  none  New Short Term/Long Term Goal(s): medication stabilization, elimination of SI thoughts, development of comprehensive mental wellness plan.   Patient Goals:  "To go home today"  Discharge Plan or Barriers: Patient recently admitted. CSW will continue to follow and assess for appropriate referrals and possible discharge planning.   Reason for Continuation of Hospitalization: Medication stabilization  Estimated Length of Stay: 1-3 days   Attendees: Patient: 08/09/2019   Physician: Landry Mellow, MD 08/09/2019  Nursing:  08/09/2019   RN Care Manager: 08/09/2019   Social Worker: Ruthann Cancer, LCSWA 08/09/2019  Recreational Therapist:  08/09/2019   Other:  08/09/2019  Other:  08/09/2019   Other: 08/09/2019       Scribe for Treatment Team: Otelia Santee, LCSWA 08/09/2019 12:11 PM

## 2019-08-09 NOTE — Progress Notes (Addendum)
[facility] MD Progress Note  08/09/2019 4:11 PM Lorinda Copland  MRN:  [id number] Subjective:   Patient is a 29 year old female with a past psychiatric history for schizoaffective disorder, bipolar type who originally presented to the [facility] obstetrical department on 07/31/2019. C section was done on 08/01/2019. Pt was off her medications so Psychiatric consultation was obtained on 08/02/2019. Pt agreed to restart oral Invega, Depakote and fluoxetine.She was discharged from the obstetrical service on 08/03/2019 with prescriptions. Patient had been IVCed by patient's sister because of becoming verbally and physically aggressive with the sister.  Pt is seen and examined today. Pt is feeling good and denies any low or depressed mood. Pt states she slept well last night. Nursing notes indicate Pt slept 6.25 hours. Pt states her appetite is good and she ate her meals. Pt reports fatigue and low energy. Pt states it might be due to low iron. Pt is worried about her baby and anxious to go home. Pt states she never threatened anyone. Pt denies all allegation by her sister and says her sister plays this IVC card to come close her children. Pt states she falls asleep sometimes at home and is difficult to take care of kids so her mom is helping with the kids. Pt states she can take care of herself and she lives alone. Currently, Pt denies any suicidal ideation, homicidal ideation and, visual and auditory hallucination. Pt denies any paranoia. Pt denies any changes in appetite and sleep. Pt denies decreased interest in activities, and energy. Pt denies racing thoughts and unusual powers. Pt denies hopelessness, worthlessness, guilt and anhedonia. Socia worker talked to Mom and Sister- indicate that Pt was not complaint with the medications and not attending all her services. Sister is the legal guardian for her 80 year old and Mom takes care of the newborn.  Principal Problem: <principal problem not  specified> Diagnosis: Active Problems:   Schizoaffective disorder, bipolar type (HCC)  Total Time spent with patient: 20 minutes  Past Psychiatric History:  Past Medical History:  Past Medical History:  Diagnosis Date  . Postpartum depression   . Pregnant   . PTSD (post-traumatic stress disorder)   . Schizo affective schizophrenia [facility])     Past Surgical History:  Procedure Laterality Date  . CESAREAN SECTION    . CESAREAN SECTION N/A 08/01/2019   Procedure: CESAREAN SECTION;  Surgeon: Lazaro Arms, MD;  Location: [facility] LD ORS;  Service: Obstetrics;  Laterality: N/A;  . HIP SURGERY     Family History: History reviewed. No pertinent family history. Family Psychiatric  History: Schizoaffective Disorder Bipolar Type. Patient admitted to multiple previous psychiatric hospitalizations. Last admission was in 2015.  Her diagnosis at that time included bipolar disorder, schizoaffective disorder; bipolar type and postpartum depression. She was off her medications at that time. She was following at [facility] on and off. Social History:  Social History   Substance and Sexual Activity  Alcohol Use No     Social History   Substance and Sexual Activity  Drug Use Not Currently    Social History   Socioeconomic History  . Marital status: Single    Spouse name: Not on file  . Number of children: Not on file  . Years of education: Not on file  . Highest education level: Not on file  Occupational History  . Not on file  Tobacco Use  . Smoking status: Current Some Day Smoker    Packs/day: 0.50    Years: 6.00  Pack years: 3.00    Types: Cigarettes  . Smokeless tobacco: Never Used  Vaping Use  . Vaping Use: Never used  Substance and Sexual Activity  . Alcohol use: No  . Drug use: Not Currently  . Sexual activity: Not Currently    Birth control/protection: None  Other Topics Concern  . Not on file  Social History Narrative  . Not on file   Social Determinants of Health    Financial Resource Strain:   . Difficulty of Paying Living Expenses:   Food Insecurity:   . Worried About Programme researcher, broadcasting/film/video in the Last Year:   . Barista in the Last Year:   Transportation Needs:   . Freight forwarder (Medical):   Marland Kitchen Lack of Transportation (Non-Medical):   Physical Activity:   . Days of Exercise per Week:   . Minutes of Exercise per Session:   Stress:   . Feeling of Stress :   Social Connections:   . Frequency of Communication with Friends and Family:   . Frequency of Social Gatherings with Friends and Family:   . Attends Religious Services:   . Active Member of Clubs or Organizations:   . Attends Banker Meetings:   Marland Kitchen Marital Status:    Additional Social History:                         Sleep: Good  Appetite:  Good  Current Medications: Current Facility-Administered Medications  Medication Dose Route Frequency Provider Last Rate Last Admin  . acetaminophen (TYLENOL) tablet 650 mg  650 mg Oral Q6H PRN Anike, Adaku C, NP      . alum & mag hydroxide-simeth (MAALOX/MYLANTA) 200-200-20 MG/5ML suspension 30 mL  30 mL Oral Q4H PRN Anike, Adaku C, NP      . divalproex (DEPAKOTE) DR tablet 750 mg  750 mg Oral BID Antonieta Pert, MD   750 mg at 08/09/19 8099  . feeding supplement (ENSURE ENLIVE) (ENSURE ENLIVE) liquid 237 mL  237 mL Oral BID BM Anike, Adaku C, NP      . FLUoxetine (PROZAC) capsule 10 mg  10 mg Oral Daily Antonieta Pert, MD   10 mg at 08/09/19 0813  . hydrOXYzine (ATARAX/VISTARIL) tablet 25 mg  25 mg Oral TID PRN Anike, Adaku C, NP      . magnesium hydroxide (MILK OF MAGNESIA) suspension 30 mL  30 mL Oral Daily PRN Anike, Adaku C, NP      . multivitamins with iron tablet 1 tablet  1 tablet Oral Daily Karsten Ro, MD   1 tablet at 08/09/19 0941  . nicotine (NICODERM CQ - dosed in mg/24 hours) patch 14 mg  14 mg Transdermal Daily Anike, Adaku C, NP      . NIFEdipine (ADALAT CC) 24 hr tablet 60 mg  60 mg  Oral Daily Antonieta Pert, MD   60 mg at 08/09/19 0934  . omega-3 acid ethyl esters (LOVAZA) capsule 1 g  1 g Oral BID Antonieta Pert, MD   1 g at 08/09/19 0813  . paliperidone (INVEGA) 24 hr tablet 6 mg  6 mg Oral QHS Antonieta Pert, MD   6 mg at 08/08/19 2046  . zolpidem (AMBIEN) tablet 5 mg  5 mg Oral QHS PRN Antonieta Pert, MD        Lab Results: No results found for this or any previous visit (from the past 48  hour(s)).  Blood Alcohol level:  Lab Results  Component Value Date   ETH <10 08/06/2019   ETH <10 01/13/2019    Metabolic Disorder Labs: Lab Results  Component Value Date   HGBA1C 5.7 (H) 12/24/2013   MPG 117 (H) 12/24/2013   No results found for: PROLACTIN Lab Results  Component Value Date   CHOL 178 12/24/2013   TRIG 155 (H) 12/24/2013   HDL 30 (L) 12/24/2013   CHOLHDL 5.9 12/24/2013   VLDL 31 12/24/2013   LDLCALC 117 (H) 12/24/2013    Physical Findings: AIMS:  , ,  ,  ,    CIWA:    COWS:     Musculoskeletal: Strength & Muscle Tone: within normal limits Gait & Station: normal Patient leans: N/A  Psychiatric Specialty Exam: Physical Exam  Review of Systems  Blood pressure (!) 153/101, pulse (!) 114, temperature 98.7 F (37.1 C), temperature source Oral, resp. rate 16, height 5\' 6"  (1.676 m), weight 104.3 kg, SpO2 98 %, unknown if currently breastfeeding.Body mass index is 37.12 kg/m.  General Appearance: Disheveled  Eye Contact:  Fair  Speech:  Normal Rate  Volume:  Normal  Mood:  Dysphoric  Affect:  Flat  Thought Process:  Descriptions of Associations: Circumstantial  Orientation:  Full (Time, Place, and Person)  Thought Content:  Negative  Suicidal Thoughts:  No  Homicidal Thoughts:  No  Memory:  Immediate;   Fair Recent;   Fair Remote;   Fair  Judgement:  Intact  Insight:  Fair  Psychomotor Activity:  Decreased  Concentration:  Concentration: Fair and Attention Span: Fair  Recall:  of Knowledge:  Fair   Language:  Fair  Akathisia:  Negative  Handed:  Right  AIMS (if indicated):     Assets:  Desire for Improvement Resilience  ADL's:  Intact  Cognition:  WNL  Sleep:  Number of Hours: 6.25     Treatment Plan Summary : Pt is admitted with above mentioned psychiatric history.  Pt is seen and examined today. Pt is feeling good and denies any low or depressed mood. Pt states she slept well last night. Nursing notes indicate Pt slept 6.25 hours. Pt states her appetite is good and she ate her meals. Pt reports fatigue and low energy. Pt states it might be due to low iron. Pt is worried about her baby and anxious to go home. Pt states she never threatened anyone. Pt denies all allegation by her sister and says her sister plays this IVC card to come close her children. Pt states she falls asleep sometimes at home and is difficult to take care of kids so her mom is helping with the kids. Pt states she can take care of herself and she lives alone. Currently, Pt denies any suicidal ideation, homicidal ideation and, visual and auditory hallucination. Pt denies any paranoia. Pt denies any changes in appetite and sleep. Pt denies decreased interest in activities, and energy. Pt denies racing thoughts and unusual powers. Pt denies hopelessness, worthlessness, guilt and anhedonia.  Socia worker talked to Mom and Sister- indicate that Pt was not complaint with the medications and not attending all her services. Sister is the legal guardian for her 67 year old and Mom takes care of the newborn. Pt has an apartment and wants to follow up at [facility] after discharge and wants the ACT or PSI team to come to her house and check on her and give her medications and Invega shot.  Vitals- Blood  pressure 153/14901mmHg. pulse 114, temperature 98.7 F (37.1 C)  Blood Alcohol Level<10  Na-140 , K-3.5 , Co2- 21, Ca- 8.5. AST- 72, Total Protein- 5.9, Hb-11.2, Hct -33.2, HCG- 105, Urine toxicology- Positive for  Benzodiazepine.  Plan- -Pt got her long acting Doctor, hospitalnvega Shot today. -Monitor Vitals. -Monitor for medication side effects.  -Continue Depakote DR Tablet 750 mg BID -Continue Ensure -Continue Prozac 10 mg -Multivitamin with iron started today. -Continue Nicoderm patch. -Continue Hydroxyzine 25 mg TID PRN for Anxiety. -Nifedipine was increased to 60 mg due to high blood pressure readings.  -Continue Omega 3 1g BID Continue Zolpidem 5 mg QHS PRN -ContinueTrazodone 50 mg QHS PRN for sleep. -Discharge planning in progress.  Daily contact with patient to assess and evaluate symptoms and progress in treatment  Karsten RoVandana  Antoin Dargis, MD 08/09/2019, 4:11 PM

## 2019-08-09 NOTE — BHH Counselor (Signed)
Adult Comprehensive Assessment  Patient ID: Brandy Sweeney, female   DOB: [birth year], 29 y.o.   MRN: [id number]  Information Source: Information source: Patient  Current Stressors:  Patient states their primary concerns and needs for treatment are:: "I was IVC'd. I need to be assessed" Patient states their goals for this hospitilization and ongoing recovery are:: "To get discharged" Educational / Learning stressors: Denies stressors Employment / Job issues: Denies stressors Family Relationships: Denies Chief Technology Officer / Lack of resources (include bankruptcy): Denies stressors Housing / Lack of housing: "Somewhat" Physical health (include injuries & life threatening diseases): Recently had Brandy cesarean Social relationships: Denies stressors Substance abuse: Denies stressors Bereavement / Loss: Denies stressors  Living/Environment/Situation:  Living Arrangements: Alone Living conditions (as described by patient or guardian): "Good" Who else lives in the home?: Alone How long has patient lived in current situation?: 1.5 years What is atmosphere in current home: Comfortable  Family History:  Marital status: Single Are you sexually active?: No What is your sexual orientation?: Straight Has your sexual activity been affected by drugs, alcohol, medication, or emotional stress?: Denies Does patient have children?: Yes How many children?: 2 How is patient's relationship with their children?: "Good"  Childhood History:  By whom was/is the patient raised?: Foster parents Additional childhood history information: "Good" Description of patient's relationship with caregiver when they were Brandy child: "It was just good" Patient's description of current relationship with people who raised him/her: "Still good" How were you disciplined when you got in trouble as Brandy child/adolescent?: Grounded Does patient have siblings?: Yes Number of Siblings: 1 Description of patient's current relationship with  siblings: "Ok" Did patient suffer any verbal/emotional/physical/sexual abuse as Brandy child?: No Did patient suffer from severe childhood neglect?: No Has patient ever been sexually abused/assaulted/raped as an adolescent or adult?: No Was the patient ever Brandy victim of Brandy crime or Brandy disaster?: No Witnessed domestic violence?: No Has patient been affected by domestic violence as an adult?: No  Education:  Highest grade of school patient has completed: Some college Currently Brandy Consulting civil engineer?: No Learning disability?: No  Employment/Work Situation:   Employment situation: Unemployed Patient's job has been impacted by current illness: No What is the longest time patient has Brandy held Brandy job?: 4 years 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 Has patient ever been in the [employer]?: No  Financial Resources:   Surveyor, quantity resources: No income Does patient have Brandy Lawyer or guardian?: No  Alcohol/Substance Abuse:   What has been your use of drugs/alcohol within the last 12 months?: Denies If attempted suicide, did drugs/alcohol play Brandy role in this?: No Alcohol/Substance Abuse Treatment Hx: Denies past history Has alcohol/substance abuse ever caused legal problems?: No  Social Support System:   Conservation officer, nature Support System: Good Describe Community Support System: Mother Type of faith/religion: Ephriam Knuckles How does patient's faith help to cope with current illness?: "Pray"  Leisure/Recreation:   Do You Have Hobbies?: Yes Leisure and Hobbies: Watch movies  Strengths/Needs:   What is the patient's perception of their strengths?: Communication Patient states they can use these personal strengths during their treatment to contribute to their recovery: "Not sure" Patient states these barriers may affect/interfere with their treatment: None Patient states these barriers may affect their return to the community: None Other important information patient  would like considered in planning for their treatment: None  Discharge Plan:   Currently receiving community mental health services: Yes (From Whom) Vesta Mixer) Patient states concerns and  preferences for aftercare planning are: Continue services with [facility] Patient states they will know when they are safe and ready for discharge when: Ready now Does patient have access to transportation?: No Does patient have financial barriers related to discharge medications?: No Patient description of barriers related to discharge medications: None Plan for no access to transportation at discharge: CSW will assess Will patient be returning to same living situation after discharge?: Yes  Summary/Recommendations:   Summary and Recommendations (to be completed by the evaluator): Patient is Brandy 29 y.o.female with Brandy reported history of Bipolar Disorder (per chart Schizoaffective) who presented to the ED under IVC due to agitation and physical aggression towards sister.  Patient just delivered Brandy baby boy on 7/8 and had discontinued psychotropic medications when she learned she was pregnant in 12/2018.  She is followed by [facility] and had been compliant with medications up until that point.  She states her provider recommended that she d/c the medications while pregnant and she was "okay" until the cesarean. While here, Brandy Sweeney can benefit from crisis stabilization, medication management, therapeutic milieu, and referrals for services.  Brandy Sweeney Brandy Sweeney. 08/09/2019

## 2019-08-10 DIAGNOSIS — F250 Schizoaffective disorder, bipolar type: Secondary | ICD-10-CM

## 2019-08-10 MED ORDER — PALIPERIDONE ER 6 MG PO TB24: 6.0000 mg | ORAL_TABLET | Freq: Every day | ORAL | 0 refills | Status: AC

## 2019-08-10 MED ORDER — NICOTINE 14 MG/24HR TD PT24: 14.0000 mg | MEDICATED_PATCH | Freq: Every day | TRANSDERMAL | 0 refills | Status: AC

## 2019-08-10 MED ORDER — FLUOXETINE HCL 10 MG PO CAPS: 10.0000 mg | ORAL_CAPSULE | Freq: Every day | ORAL | 0 refills | Status: AC

## 2019-08-10 MED ORDER — HYDROXYZINE HCL 25 MG PO TABS: 25.0000 mg | ORAL_TABLET | Freq: Three times a day (TID) | ORAL | 0 refills | Status: AC | PRN

## 2019-08-10 MED ORDER — DIVALPROEX SODIUM 250 MG PO DR TAB: 750.0000 mg | DELAYED_RELEASE_TABLET | Freq: Two times a day (BID) | ORAL | 0 refills | Status: AC

## 2019-08-10 MED ORDER — ZOLPIDEM TARTRATE 5 MG PO TABS: 5.0000 mg | ORAL_TABLET | Freq: Every evening | ORAL | 0 refills | Status: AC | PRN

## 2019-08-10 MED ORDER — OMEGA-3-ACID ETHYL ESTERS 1 G PO CAPS: 1.0000 g | ORAL_CAPSULE | Freq: Two times a day (BID) | ORAL | 0 refills | Status: AC

## 2019-08-10 MED ORDER — NIFEDIPINE ER 60 MG PO TB24: 60.0000 mg | ORAL_TABLET | Freq: Every day | ORAL | 0 refills | Status: AC

## 2019-08-10 NOTE — Progress Notes (Signed)
D: Pt A & O X 4. Denies SI, HI, AVH and pain at this time. D/C home as ordered. Pt called Brandy Sweeney for transportation home. A: D/C instructions reviewed with pt including prescriptions and follow up appointment; compliance encouraged. All belongings from locker 16 returned to pt at time of departure. Scheduled medications administered with verbal education and effects monitored. Safety checks maintained without incident till time of d/c.  R: Pt receptive to care. Compliant with medications when offered. Denies adverse drug reactions when assessed. Verbalized understanding related to d/c instructions. Signed belonging sheet in agreement with items received from locker. Ambulatory with a steady gait. Appears to be in no physical distress at time of departure.

## 2019-08-10 NOTE — BHH Group Notes (Signed)
LCSW Group Therapy Note  08/10/2019   10:00-11:00am   Type of Therapy and Topic:  Group Therapy: Anger Cues and Responses  Participation Level:  Active   Description of Group:   In this group, patients learned how to recognize the physical, cognitive, emotional, and behavioral responses they have to anger-provoking situations.  They identified a recent time they became angry and how they reacted.  They analyzed how their reaction was possibly beneficial and how it was possibly unhelpful.  The group discussed a variety of healthier coping skills that could help with such a situation in the future.  Focus was placed on how helpful it is to recognize the underlying emotions to our anger, because working on those can lead to a more permanent solution as well as our ability to focus on the important rather than the urgent.  Therapeutic Goals: 1. Patients will remember their last incident of anger and how they felt emotionally and physically, what their thoughts were at the time, and how they behaved. 2. Patients will identify how their behavior at that time worked for them, as well as how it worked against them. 3. Patients will explore possible new behaviors to use in future anger situations. 4. Patients will learn that anger itself is normal and cannot be eliminated, and that healthier reactions can assist with resolving conflict rather than worsening situations.  Summary of Patient Progress:  The patient shared that her most recent time of anger was when argued with her sister and said they were able to eventually able to work it out.  Therapeutic Modalities:   Cognitive Behavioral Therapy  Evorn Gong

## 2019-08-10 NOTE — BHH Suicide Risk Assessment (Signed)
[facility] Discharge Suicide Risk Assessment   Principal Problem: <principal problem not specified> Discharge Diagnoses: Active Problems:   Schizoaffective disorder, bipolar type (HCC)   Total Time spent with patient: 15 minutes  Musculoskeletal: Strength & Muscle Tone: within normal limits Gait & Station: normal Patient leans: N/A  Psychiatric Specialty Exam: Review of Systems  All other systems reviewed and are negative.   Blood pressure (!) 129/107, pulse (!) 117, temperature 98.6 F (37 C), temperature source Oral, resp. rate 16, height 5\' 6"  (1.676 m), weight 104.3 kg, SpO2 98 %, unknown if currently breastfeeding.Body mass index is 37.12 kg/m.  General Appearance: Casual  Eye Contact::  Fair  Speech:  Normal Rate409  Volume:  Normal  Mood:  Anxious  Affect:  Congruent  Thought Process:  Coherent and Descriptions of Associations: Intact  Orientation:  Full (Time, Place, and Person)  Thought Content:  Negative  Suicidal Thoughts:  No  Homicidal Thoughts:  No  Memory:  Immediate;   Fair Recent;   Fair Remote;   Fair  Judgement:  Intact  Insight:  Fair  Psychomotor Activity:  Normal  Concentration:  Fair  Recall:  [id number] of Knowledge:Fair  Language: Fair  Akathisia:  Negative  Handed:  Right  AIMS (if indicated):     Assets:  Desire for Improvement Housing Resilience Social Support  Sleep:  Number of Hours: 10  Cognition: WNL  ADL's:  Intact   Mental Status Per Nursing Assessment::   On Admission:  NA  Demographic Factors:  Low socioeconomic status, Living alone and Unemployed  Loss Factors: NA  Historical Factors: Impulsivity  Risk Reduction Factors:   Sense of responsibility to family and Positive social support  Continued Clinical Symptoms:  Bipolar Disorder:   Mixed State Schizophrenia:   Less than 8 years old Paranoid or undifferentiated type  Cognitive Features That Contribute To Risk:  None    Suicide Risk:  Minimal: No identifiable  suicidal ideation.  Patients presenting with no risk factors but with morbid ruminations; may be classified as minimal risk based on the severity of the depressive symptoms   Follow-up Information    [facility], [facility]. Go on 08/14/2019.   Why: You have an intake appointment with 08/16/2019 on 08/14/19 1:00 pm to obtain medication management and CST services.  Please bring your photo ID, insurance card and discharge summary. Length of this appointment is 2 hours. Contact information: [address] [place] [id number]               Plan Of Care/Follow-up recommendations:  Activity:  ad lib Tests:  Need to have depakote level, liver function enzymes and complete cell count checked within 10 days at your medical or psychiatric follow up because of taking depakote  [phone], MD 08/10/2019, 7:50 AM

## 2019-08-10 NOTE — Progress Notes (Addendum)
  [facility] Adult Case Management Discharge Plan :  Will you be returning to the same living situation after discharge:  Yes,  alone At discharge, do you have transportation home?: Yes,  says she can supply her own Brandy Goad Do you have the ability to pay for your medications: Yes,  Medicaid  Release of information consent forms completed and emailed to Medical Records, then turned in to Medical Records by CSW.   Patient to Follow up at:  Follow-up Information    [facility], [facility]. Go on 08/14/2019.   Why: You have an intake appointment with Brandy Sweeney on 08/14/19 1:00 pm to obtain medication management and CST services.  Please bring your photo ID, insurance card and discharge summary. Length of this appointment is 2 hours. Contact information: [address] [place] [address] [phone]               Next level of care provider has access to [facility] Link:no  Safety Planning and Suicide Prevention discussed: Yes,  with mother  Have you used any form of tobacco in the last 30 days? (Cigarettes, Smokeless Tobacco, Cigars, and/or Pipes): Yes  Has patient been referred to the Quitline?: Patient refused referral  Patient has been referred for addiction treatment: N/A  [name], LCSW 08/10/2019, 9:51 AM

## 2019-08-10 NOTE — Discharge Summary (Signed)
Physician Discharge Summary Note  Patient:  Brandy Sweeney is an 29 y.o., female MRN:  [id number] DOB:  [birth year] Patient phone:  [phone] (home)  Patient address:   [address] [place] [address],  Total Time spent with patient: 20 minutes  Date of Admission:  08/08/2019 Date of Discharge: 08/10/2019  Reason for Admission:  Patient is a 29 year old female with a past psychiatric history for schizoaffective disorder, bipolar type who originally presented to the [facility] obstetrical department on 07/31/2019 for Obg Visit and C section was done on 08/01/2019. Patient had been IVCed by patient's sister because of becoming verbally and physically aggressive with the sister.   Principal Problem: <principal problem not specified> Discharge Diagnoses: Active Problems:   Schizoaffective disorder, bipolar type [facility])   Past Psychiatric History: Patient admitted to multiple previous psychiatric hospitalizations.  Her last psychiatric hospitalization at our facility was in 2015.  Her diagnosis at that time included bipolar disorder, schizoaffective disorder; bipolar type and postpartum depression.  She was seen in the emergency department at [facility] emergency department in December 2020.  The report at that time had been that she been off her medications for approximately 3 months.  She had been placed under involuntary commitment by her sister.  Her medications were restarted including a long-acting Abilify injection.  She apparently had received this 1 to 2 weeks prior to her evaluation in the emergency department and it does not appear as though she had been admitted.  She has been followed at [facility] on and off for some period of time.  She also has been followed by [facility] in the past.  Past Medical History:  Past Medical History:  Diagnosis Date  . Postpartum depression   . Pregnant   . PTSD (post-traumatic stress disorder)   . Schizo affective  schizophrenia [facility])     Past Surgical History:  Procedure Laterality Date  . CESAREAN SECTION    . CESAREAN SECTION N/A 08/01/2019   Procedure: CESAREAN SECTION;  Surgeon: Lazaro Arms, MD;  Location: [facility] LD [facility]S;  Service: Obstetrics;  Laterality: N/A;  . HIP SURGERY     Family History: History reviewed. No pertinent family history. Family Psychiatric  History: Noncontributory Social History:  Social History   Substance and Sexual Activity  Alcohol Use No     Social History   Substance and Sexual Activity  Drug Use Not Currently    Social History   Socioeconomic History  . Marital status: Single    Spouse name: Not on file  . Number of children: Not on file  . Years of education: Not on file  . Highest education level: Not on file  Occupational History  . Not on file  Tobacco Use  . Smoking status: Current Some Day Smoker    Packs/day: 0.50    Years: 6.00    Pack years: 3.00    Types: Cigarettes  . Smokeless tobacco: Never Used  Vaping Use  . Vaping Use: Never used  Substance and Sexual Activity  . Alcohol use: No  . Drug use: Not Currently  . Sexual activity: Not Currently    Birth control/protection: None  Other Topics Concern  . Not on file  Social History Narrative  . Not on file   Social Determinants of Health   Financial Resource Strain:   . Difficulty of Paying Living Expenses:   Food Insecurity:   . Worried About Programme researcher, broadcasting/film/video in the Last  Year:   . Ran Out of Food in the Last Year:   Transportation Needs:   . Freight forwarderLack of Transportation (Medical):   Marland Kitchen. Lack of Transportation (Non-Medical):   Physical Activity:   . Days of Exercise per Week:   . Minutes of Exercise per Session:   Stress:   . Feeling of Stress :   Social Connections:   . Frequency of Communication with Friends and Family:   . Frequency of Social Gatherings with Friends and Family:   . Attends Religious Services:   . Active Member of Clubs or Organizations:   . Attends Occupational hygienistClub  or Organization Meetings:   Marland Kitchen. Marital Status:     Hospital Course:  Patient is a 29 year old female with a past psychiatric history for schizoaffective disorder, bipolar type who [facility] on 07/31/2019. C section was done on 08/01/2019. Pt was off her medications so Psychiatric consultation was obtained on 08/02/2019. Pt agreed to restart oral Invega, Depakote and fluoxetine.She was discharged from the obstetrical service on 08/03/2019 with prescriptions. Patient had been IVCed by patient's sister because of becoming verbally and physically aggressive with the sister. At Admission, Pt stated she never threatened anyone. Pt denied all allegation by her sister and says her sister plays this IVC card to come close her children. Pt stated she falls asleep sometimes at home and is difficult to take care of kids so her mom is helping with the kids. Pt stated she can take care of herself and she lives alone.Pt seems anxious but alert, cooperative and oriented. Pt is seen and examined today before discharge. Pt is feeling good and denies any low or depressed mood. Pt is anxious to go home today. Pt states she slept well last night. Nursing notes indicate Pt slept 10 hours. Pt states she ate all her meals and denies any change in appetite. Currently,Pt denies any suicidal ideation, homicidal ideation and, visual and auditory hallucination. Pt denies any paranoia.Pt denies decreased interest in activities, and energy. Pt denies any side effects from medication. Pt states her energy is better that yesterday. Pt denies racing thoughts and unusual powers. Pt denies hopelessness, worthlessness, guilt and anhedonia. Social worker talked to Mom and Sister yesterday- indicated that Pt was not compliant with the medications and not attending all her services. Sister is the legal guardian for her 29 year old  And patient POA and Mom takes care of the  newborn. Vitals- BP- 136/7090mmHg, PR-91 Important Labs- Blood Alcohol Level<10, Na-140 , K-3.5 , Co2- 21, Ca- 8.5. AST- 72, Total Protein- 5.9, Hb-11.2, Hct -33.2, HCG- 105, Urine toxicology- Positive for Benzodiazepine. In the inpatient unit, Pt was treated on Invega Shot on 08/09/19, Depakote DR Tablet 750 mg BID, Ensure, Prozac 10 mg, Multivitamin with iron, Nicoderm patch, Hydroxyzine 25 mg TID PRN for Anxiety, Nifedipine was increased to 60 mg due to high blood pressure readings, Omega 3 1g BID, Zolpidem 5 mg QHS PRN, and Trazodone 50 mg QHS PRN for sleep. During course Pt felt better with her mood and anxiety. [facility]. Pt is discharged today to her home. Depakote level, LFT's and CBC at follow up appointment. Physical Findings: AIMS:  , ,  ,  ,    CIWA:    COWS:     Musculoskeletal: Strength & Muscle Tone: within normal limits Gait & Station: normal Patient leans: N/A  Psychiatric Specialty Exam: Physical Exam  Review of  Systems  Blood pressure 136/90, pulse 91, temperature 98.6 F (37 C), temperature source Oral, resp. rate 16, height 5\' 6"  (1.676 m), weight 104.3 kg, SpO2 98 %, unknown if currently breastfeeding.Body mass index is 37.12 kg/m.  General Appearance: Casual  Eye Contact:  Fair  Speech:  Normal Rate  Volume:  Normal  Mood:  Euthymic  Affect:  Constricted  Thought Process:  Coherent  Orientation:  Full (Time, Place, and Person)  Thought Content:  Negative  Suicidal Thoughts:  No  Homicidal Thoughts:  No  Memory:  Immediate;   Fair Recent;   Fair Remote;   Fair  Judgement:  Intact  Insight:  Fair  Psychomotor Activity:  Decreased  Concentration:  Concentration: Fair and Attention Span: Fair  Recall:  of Knowledge:  Fair  Language:  Fair  Akathisia:  Negative  Handed:  Right  AIMS (if indicated):     Assets:  Resilience  ADL's:  Intact  Cognition:  WNL  Sleep:  Number of Hours: 10     Have you used  any form of tobacco in the last 30 days? (Cigarettes, Smokeless Tobacco, Cigars, and/or Pipes): Yes  Has this patient used any form of tobacco in the last 30 days? (Cigarettes, Smokeless Tobacco, Cigars, and/or Pipes) Yes, Nicotine patch was provided. Prescription given at discharge.  Blood Alcohol level:  Lab Results  Component Value Date   ETH <10 08/06/2019   ETH <10 01/13/2019    Metabolic Disorder Labs:  Lab Results  Component Value Date   HGBA1C 5.7 (H) 12/24/2013   MPG 117 (H) 12/24/2013   No results found for: PROLACTIN Lab Results  Component Value Date   CHOL 178 12/24/2013   TRIG 155 (H) 12/24/2013   HDL 30 (L) 12/24/2013   CHOLHDL 5.9 12/24/2013   VLDL 31 12/24/2013   LDLCALC 117 (H) 12/24/2013    See Psychiatric Specialty Exam and Suicide Risk Assessment completed by Attending Physician prior to discharge.  Discharge destination:  Home  Is patient on multiple antipsychotic therapies at discharge:  No   Has Patient had three or more failed trials of antipsychotic monotherapy by history:  No  Recommended Plan for Multiple Antipsychotic Therapies: NA  Discharge Instructions    No dressing needed   Complete by: As directed      Allergies as of 08/10/2019      Reactions   Trazodone And Nefazodone Hives   Also itching   Penicillins Rash      Medication List    STOP taking these medications   acetaminophen 500 MG tablet Commonly known as: TYLENOL   ibuprofen 800 MG tablet Commonly known as: ADVIL   metroNIDAZOLE 250 MG tablet Commonly known as: FLAGYL   oxyCODONE 5 MG immediate release tablet Commonly known as: Oxy IR/ROXICODONE   senna-docusate 8.6-50 MG tablet Commonly known as: Senokot-S     TAKE these medications     Indication  divalproex 250 MG DR tablet Commonly known as: DEPAKOTE Take 3 tablets (750 mg total) by mouth 2 (two) times daily. For mood stabilization What changed:   medication strength  how much to take  when to  take this  additional instructions  Another medication with the same name was removed. Continue taking this medication, and follow the directions you see here.  Indication: Mood stabilization   FLUoxetine 10 MG capsule Commonly known as: PROZAC Take 1 capsule (10 mg total) by mouth daily. For depression What changed: additional instructions  Indication:  Major Depressive Disorder   hydrOXYzine 25 MG tablet Commonly known as: ATARAX/VISTARIL Take 1 tablet (25 mg total) by mouth 3 (three) times daily as needed for anxiety.  Indication: Feeling Anxious   nicotine 14 mg/24hr patch Commonly known as: NICODERM CQ - dosed in mg/24 hours Place 1 patch (14 mg total) onto the skin daily. (May buy from over the counter): For smoking cessation  Indication: Nicotine Addiction   NIFEdipine 60 MG 24 hr tablet Commonly known as: ADALAT CC Take 1 tablet (60 mg total) by mouth daily. For high blood pressure Start taking on: August 11, 2019 What changed:   medication strength  how much to take  additional instructions  Indication: High Blood Pressure Disorder   omega-3 acid ethyl esters 1 g capsule Commonly known as: LOVAZA Take 1 capsule (1 g total) by mouth 2 (two) times daily. For hypertriglyceride  Indication: High Amount of Triglycerides in the Blood   paliperidone 6 MG 24 hr tablet Commonly known as: INVEGA Take 1 tablet (6 mg total) by mouth at bedtime. For mood control What changed:   medication strength  how much to take  when to take this  additional instructions  Indication: Mood control   zolpidem 5 MG tablet Commonly known as: AMBIEN Take 1 tablet (5 mg total) by mouth at bedtime as needed for sleep.  Indication: Trouble Sleeping       Follow-up Information    [facility]. Go on 08/14/2019.   Why: You have an intake appointment with [name] on 08/14/19 1:00 pm to obtain medication management and CST services.  Please bring your photo ID,  insurance card and discharge summary. Length of this appointment is 2 hours. Contact information: [address] [place] [address] [phone]               Follow-up recommendations:  Activity:  As Tolerated Diet:  As recommended by Primary Care Physician  Comments: Pt will follow up at [facility]. Depakote levels LFT, and CBC within 10 days at Medical or Psychiatric Follow up.  Prescriptions given at discharge.  Patient agreeable to plan.  Given opportunity to ask questions.  Appears to feel comfortable with discharge denies any current suicidal or homicidal thought. Patient is also instructed prior to discharge to: Take all medications as prescribed by his/her mental healthcare provider. Report any adverse effects and or reactions from the medicines to his/her outpatient provider promptly. Patient has been instructed & cautioned: To not engage in alcohol and or illegal drug use while on prescription medicines. In the event of worsening symptoms, patient is instructed to call the crisis hotline, 911 and or go to the nearest ED for appropriate evaluation and treatment of symptoms. To follow-up with his/her primary care provider for your other medical issues, concerns and or health care needs.  Signed: Karsten Ro, MD 08/10/2019, 11:17 AM

## 2019-08-14 NOTE — Progress Notes (Signed)
CSW faxed pt's discharge summary, med list and H&P to Ellison Carwin at [employer].   Wells Guiles, LCSW, LCAS Disposition CSW [facility] [phone]

## 2019-08-29 ENCOUNTER — Telehealth: Payer: Self-pay | Admitting: General Practice

## 2019-08-29 NOTE — Telephone Encounter (Signed)
Patient aware of Postpartum appointment that is scheduled on 09/18/2019 at 1:30pm.  Pt verbalized understanding.

## 2019-09-11 ENCOUNTER — Other Ambulatory Visit: Payer: Self-pay

## 2019-09-11 ENCOUNTER — Emergency Department (HOSPITAL_COMMUNITY)
Admission: EM | Admit: 2019-09-11 | Discharge: 2019-09-14 | Disposition: A | Discharge status: Home / Self Care | Payer: Medicaid Other | Attending: Emergency Medicine | Admitting: Emergency Medicine

## 2019-09-11 DIAGNOSIS — R259 Unspecified abnormal involuntary movements: Secondary | ICD-10-CM | POA: Diagnosis not present

## 2019-09-11 DIAGNOSIS — F17210 Nicotine dependence, cigarettes, uncomplicated: Secondary | ICD-10-CM | POA: Insufficient documentation

## 2019-09-11 DIAGNOSIS — F250 Schizoaffective disorder, bipolar type: Secondary | ICD-10-CM | POA: Insufficient documentation

## 2019-09-11 DIAGNOSIS — F259 Schizoaffective disorder, unspecified: Secondary | ICD-10-CM | POA: Diagnosis present

## 2019-09-11 DIAGNOSIS — Z20822 Contact with and (suspected) exposure to covid-19: Secondary | ICD-10-CM | POA: Insufficient documentation

## 2019-09-11 DIAGNOSIS — Z046 Encounter for general psychiatric examination, requested by authority: Secondary | ICD-10-CM

## 2019-09-11 LAB — COMPREHENSIVE METABOLIC PANEL
ALT: 18 U/L (ref 0–44)
AST: 28 U/L (ref 15–41)
Albumin: 4.5 g/dL (ref 3.5–5.0)
Alkaline Phosphatase: 68 U/L (ref 38–126)
Anion gap: 12 (ref 5–15)
BUN: 14 mg/dL (ref 6–20)
CO2: 21 mmol/L — ABNORMAL LOW (ref 22–32)
Calcium: 9.7 mg/dL (ref 8.9–10.3)
Chloride: 107 mmol/L (ref 98–111)
Creatinine, Ser: 0.94 mg/dL (ref 0.44–1.00)
GFR calc Af Amer: >60 mL/min (ref >60)
GFR calc non Af Amer: >60 mL/min (ref >60)
Glucose, Bld: 104 mg/dL — ABNORMAL HIGH (ref 70–99)
Potassium: 3.9 mmol/L (ref 3.5–5.1)
Sodium: 140 mmol/L (ref 135–145)
Total Bilirubin: 0.8 mg/dL (ref 0.3–1.2)
Total Protein: 7.3 g/dL (ref 6.5–8.1)

## 2019-09-11 LAB — CBC
HCT: 40.4 % (ref 36.0–46.0)
Hemoglobin: 13.2 g/dL (ref 12.0–15.0)
MCH: 30.6 pg (ref 26.0–34.0)
MCHC: 32.7 g/dL (ref 30.0–36.0)
MCV: 93.7 fL (ref 80.0–100.0)
Platelets: 275 10*3/uL (ref 150–400)
RBC: 4.31 MIL/uL (ref 3.87–5.11)
RDW: 12.7 % (ref 11.5–15.5)
WBC: 9.3 10*3/uL (ref 4.0–10.5)
nRBC: 0 % (ref 0.0–0.2)

## 2019-09-11 LAB — SALICYLATE LEVEL: Salicylate Lvl: <7 mg/dL — ABNORMAL LOW (ref 7.0–30.0)

## 2019-09-11 LAB — RAPID URINE DRUG SCREEN, HOSP PERFORMED
Amphetamines: NOT DETECTED
Barbiturates: NOT DETECTED
Benzodiazepines: NOT DETECTED
Cocaine: NOT DETECTED
Opiates: NOT DETECTED
Tetrahydrocannabinol: NOT DETECTED

## 2019-09-11 LAB — ACETAMINOPHEN LEVEL: Acetaminophen (Tylenol), Serum: <10 ug/mL — ABNORMAL LOW (ref 10–30)

## 2019-09-11 LAB — I-STAT BETA HCG BLOOD, ED (MC, WL, AP ONLY): I-stat hCG, quantitative: <5 m[IU]/mL (ref <5)

## 2019-09-11 LAB — ETHANOL: Alcohol, Ethyl (B): <10 mg/dL (ref <10)

## 2019-09-11 NOTE — ED Triage Notes (Signed)
Pt here accompanied by police with IVC paperwork taken out by her sister. Per paperwork pt has not had her medications in 10 days, goes outside in the middle of the night hallucinating and screaming, has not slept in several days and is threatening others. Pt yelling and is agitated in triage.

## 2019-09-12 LAB — SARS CORONAVIRUS 2 BY RT PCR (HOSPITAL ORDER, PERFORMED IN ~~LOC~~ HOSPITAL LAB): SARS Coronavirus 2: NEGATIVE

## 2019-09-12 MED ORDER — PALIPERIDONE PALMITATE ER 234 MG/1.5ML IM SUSY
234.0000 mg | PREFILLED_SYRINGE | INTRAMUSCULAR | Status: DC
  Administered 2019-09-12: 234 mg via INTRAMUSCULAR
  Filled 2019-09-12: qty 1.5

## 2019-09-12 MED ORDER — PALIPERIDONE PALMITATE ER 234 MG/1.5ML IM SUSY: 234.0000 mg | PREFILLED_SYRINGE | Freq: Once | INTRAMUSCULAR | Status: DC

## 2019-09-12 NOTE — ED Notes (Signed)
Pt arrived to [facility]m [facility] - ambulatory - wearing burgundy scrubs and eyeglasses. Pt ate lunch. Pt aware of tx plan. Pt noted to be calm, cooperative, pleasant. Sitter w/pt.

## 2019-09-12 NOTE — ED Provider Notes (Signed)
[facility] EMERGENCY DEPARTMENT Provider Note   CSN: [id number] Arrival date & time: 09/11/19  1402     History No chief complaint on file.   Brandy Sweeney is a 29 y.o. female.  HPI 29 year old female with a history of schizoaffective schizophrenia, PTSD, postpartum depression presents to the ER under IVC taken out by her sister.  IVC paperwork notes that the patient has not been taking her medications for at least 10 days, was outside in the middle of the night hallucinating and screaming, has had little sleep and threatening anyone who tries to enter her home.  Patient was noted to be yelling and agitated in triage.  However, on arrival to the ER, the patient was calm, cooperative and pleasant.  She states that her and her sister got into an argument yesterday over her son's birthday party.  She states that her sister informed her that she was not going to take her to her son's birthday party and that she got sick.  She thinks that this is why the IVC paperwork was taken out.  She denies any suicidal or homicidal ideations.  She endorses taking the medication regularly, but states that she did not get her shot of Invega yesterday.  She denies any hallucinations, visual or auditory.  No recent alcohol or drug use.  Denies any fevers, chills, shortness of breath, abdominal pain, chest pain.    Past Medical History:  Diagnosis Date  . Postpartum depression   . Pregnant   . PTSD (post-traumatic stress disorder)   . Schizo affective schizophrenia [facility])     Patient Active Problem List   Diagnosis Date Noted  . No prenatal care in current pregnancy 08/01/2019  . Severe preeclampsia 07/31/2019  . History of cesarean section 07/31/2019  . GBS bacteriuria 03/19/2019  . Schizoaffective disorder, depressive type (HCC)   . Schizoaffective disorder, bipolar type (HCC)   . Postpartum depression   . Bipolar affective disorder, current episode hypomanic (HCC) 12/21/2013  .  Schizoaffective disorder, unspecified type [facility])     Past Surgical History:  Procedure Laterality Date  . CESAREAN SECTION    . CESAREAN SECTION N/A 08/01/2019   Procedure: CESAREAN SECTION;  Surgeon: Lazaro Arms, MD;  Location: [facility] LD ORS;  Service: Obstetrics;  Laterality: N/A;  . HIP SURGERY       OB History    Gravida  2   Para  2   Term  2   Preterm  0   AB  0   Living  2     SAB  0   TAB  0   Ectopic  0   Multiple  0   Live Births  1           No family history on file.  Social History   Tobacco Use  . Smoking status: Current Some Day Smoker    Packs/day: 0.50    Years: 6.00    Pack years: 3.00    Types: Cigarettes  . Smokeless tobacco: Never Used  Vaping Use  . Vaping Use: Never used  Substance Use Topics  . Alcohol use: No  . Drug use: Not Currently    Home Medications Prior to Admission medications   Medication Sig Start Date End Date Taking? Authorizing Provider  divalproex (DEPAKOTE) 250 MG DR tablet Take 3 tablets (750 mg total) by mouth 2 (two) times daily. For mood stabilization 08/10/19   Armandina Stammer I, NP  FLUoxetine (PROZAC) 10 MG  capsule Take 1 capsule (10 mg total) by mouth daily. For depression 08/10/19   Armandina Stammer I, NP  hydrOXYzine (ATARAX/VISTARIL) 25 MG tablet Take 1 tablet (25 mg total) by mouth 3 (three) times daily as needed for anxiety. 08/10/19   Armandina Stammer I, NP  nicotine (NICODERM CQ - DOSED IN MG/24 HOURS) 14 mg/24hr patch Place 1 patch (14 mg total) onto the skin daily. (May buy from over the counter): For smoking cessation 08/10/19   Armandina Stammer I, NP  NIFEdipine (ADALAT CC) 60 MG 24 hr tablet Take 1 tablet (60 mg total) by mouth daily. For high blood pressure 08/11/19   Nwoko, Nicole Kindred I, NP  omega-3 acid ethyl esters (LOVAZA) 1 g capsule Take 1 capsule (1 g total) by mouth 2 (two) times daily. For hypertriglyceride 08/10/19   Armandina Stammer I, NP  paliperidone (INVEGA) 6 MG 24 hr tablet Take 1 tablet (6 mg total) by  mouth at bedtime. For mood control 08/10/19   Armandina Stammer I, NP  zolpidem (AMBIEN) 5 MG tablet Take 1 tablet (5 mg total) by mouth at bedtime as needed for sleep. 08/10/19   Armandina Stammer I, NP    Allergies    Trazodone and nefazodone and Penicillins  Review of Systems   Review of Systems  Constitutional: Negative for chills and fever.  Psychiatric/Behavioral: Negative for agitation, behavioral problems, decreased concentration, sleep disturbance and suicidal ideas. The patient is not nervous/anxious.     Physical Exam Updated Vital Signs BP (!) 109/58 (BP Location: Left Arm)   Pulse 74   Temp 99.1 F (37.3 C) (Oral)   Resp 16   SpO2 95%   Physical Exam Vitals reviewed.  Constitutional:      Appearance: Normal appearance.  HENT:     Head: Normocephalic and atraumatic.  Eyes:     General:        Right eye: No discharge.        Left eye: No discharge.     Extraocular Movements: Extraocular movements intact.     Conjunctiva/sclera: Conjunctivae normal.  Musculoskeletal:        General: No swelling. Normal range of motion.  Skin:    General: Skin is warm and dry.  Neurological:     General: No focal deficit present.     Mental Status: She is alert and oriented to person, place, and time.  Psychiatric:        Mood and Affect: Mood normal.        Behavior: Behavior normal.     ED Results / Procedures / Treatments   Labs (all labs ordered are listed, but only abnormal results are displayed) Labs Reviewed  COMPREHENSIVE METABOLIC PANEL - Abnormal; Notable for the following components:      Result Value   CO2 21 (*)    Glucose, Bld 104 (*)    All other components within normal limits  SALICYLATE LEVEL - Abnormal; Notable for the following components:   Salicylate Lvl <7.0 (*)    All other components within normal limits  ACETAMINOPHEN LEVEL - Abnormal; Notable for the following components:   Acetaminophen (Tylenol), Serum <10 (*)    All other components within normal  limits  SARS CORONAVIRUS 2 BY RT PCR (HOSPITAL ORDER, PERFORMED IN [facility])  ETHANOL  CBC  RAPID URINE DRUG SCREEN, HOSP PERFORMED  I-STAT BETA HCG BLOOD, ED ([facility], [facility], [facility] ONLY)    EKG None  Radiology No results found.  Procedures Procedures (including  critical care time)  Medications Ordered in ED Medications - No data to display  ED Course  I have reviewed the triage vital signs and the nursing notes.  Pertinent labs & imaging results that were available during my care of the patient were reviewed by me and considered in my medical decision making (see chart for details).    MDM Rules/Calculators/A&P                          Patient presents to the ER under IVC taken out by her sister for medication noncompliance, aggressive behavior and hallucinations requiring medical current clearance for evaluation by psychiatry.  On presentation, the patient is calm, cooperative, pleasant.  Vitals personally reviewed by me, overall reassuring. FIRST exam performed by myself, authorized by Dr. Rubin Payor.  I personally reviewed his lab work, which did not show any significant abnormalities.  CBC without leukocytosis, normal hemoglobin.  CMP without electrolyte abnormalities, normal BUN/creatinine.  Negative acetaminophen, salicylate, ethanol.  UDS negative. She has been medically cleared for further evaluation by TTS.  Dispo according to the recommendation.  Final Clinical Impression(s) / ED Diagnoses Final diagnoses:  Involuntary commitment    Rx / DC Orders ED Discharge Orders    None       Leone Brand 09/12/19 1355    Benjiman Core, MD 09/12/19 1556

## 2019-09-12 NOTE — BH Assessment (Signed)
Tele Assessment Note   Patient Name: Brandy Sweeney MRN: [id number] Referring Physician: Vernia Buff Location of Patient: [facility] Location of Provider: Behavioral Health TTS Department  Brandy Sweeney is an 29 y.o. female who was brought to the [facility] on IVC petitioned by her sister.  Patient has not had her Invega Injection for the past ten days and patient. according to sister states that patient has been acting erratically.  TTS spoke to patient's sister, Brandy Sweeney [phone], who states that she went to get patient to get her to take her to get her Invega injection last week, but she states that she and her sister got into an argument because she would not pay the patient's phone bill so patient refused to get a shot.  Sister states that two nights ago patient repeatedly called her and cursed her out and patient called her foster mother and cursed her out for no reason.  Sister states that patient's neighbor called to say that at 3 am patient was in her yard yelling and screaming.  Sister states that patient is very disorganized, she has not been sleeping and has been highly agitated.  Patient on the other hand states that it was her sister who got angry (patient would not identify what she and her sister were arguing about) and would not taker her to get her injection. However, sister is a Facilities manager who works for the [employer] and it is unlikely that patient's sister would lie against patient and sister seemed to be extremely credible.  Patient denies that she has been erratic with her behavior.  Patient states that she was angry with her sister because she could not attend her son's birthday custody. (Most likely sister did not want patient there because of her recent behaviors)   Patient states that her sister has custody of her 56 year old son and her other sister has custody of her newborn.    Patient is in the process of becoming a patient at  [facility], but is a new patient there and this was going to  be her first injection.  Patient has a Administrator, arts who helps to monitor her.  Patient presents as calm and cooperative.  She is alert and oriented.  Her judgment, insight and impulse control are impaired.  Her thoughts are currently organized and her memory intact.  Patient denies any current SI/HI/Psychosis and does not currenlty appear to responding to any internal stimuli.                       Diagnosis:F25.0 Schizoaffective Disorder Bipolar Type  Past Medical History:  Past Medical History:  Diagnosis Date  . Postpartum depression   . Pregnant   . PTSD (post-traumatic stress disorder)   . Schizo affective schizophrenia [facility])     Past Surgical History:  Procedure Laterality Date  . CESAREAN SECTION    . CESAREAN SECTION N/A 08/01/2019   Procedure: CESAREAN SECTION;  Surgeon: Lazaro Arms, MD;  [facility];  Service: Obstetrics;  Laterality: N/A;  . HIP SURGERY      Family History: No family history on file.  Social History:  reports that she has been smoking cigarettes. She has a 3.00 pack-year smoking history. She has never used smokeless tobacco. She reports previous drug use. She reports that she does not drink alcohol.  Additional Social History:  Alcohol / Drug Use Pain Medications: Please see MAR Prescriptions: Please see MAR Over the Counter: Please see  MAR History of alcohol / drug use?: No history of alcohol / drug abuse Longest period of sobriety (when/how long): N/A  CIWA: CIWA-Ar BP: 120/72 Pulse Rate: 76 COWS:    Allergies:  Allergies  Allergen Reactions  . Abilify Maintena [Aripiprazole] Other (See Comments)    NOT EFFECTIVE, per sister, ONLY Hinda Glatter works  . Trazodone And Nefazodone Hives and Itching       . Penicillins Rash    Home Medications: (Not in a hospital admission)   OB/GYN Status:  No LMP recorded (lmp unknown).  General Assessment Data Location of Assessment: [facility] ED TTS Assessment: In system Is this a Tele or  Face-to-Face Assessment?: Tele Assessment Is this an Initial Assessment or a Re-assessment for this encounter?: Initial Assessment Patient Accompanied by:: Other Language Other than English:  (GPD) Living Arrangements: Other (Comment) (lives on her own) What gender do you identify as?: Female Date Telepsych consult ordered in [facility]: 09/12/19 Time Telepsych consult ordered in [facility]:  (13:46) Marital status: Single Maiden name: Warr Pregnancy Status: Other (Comment) (post partum x 1 month) Living Arrangements: Alone Can pt return to current living arrangement?: Yes Admission Status: Involuntary Petitioner: Family member Is patient capable of signing voluntary admission?: Yes Referral Source: Self/Family/Friend Insurance type: Medicaid     Crisis Care Plan Living Arrangements: Alone Legal Guardian: Other: (self) Name of Psychiatrist: [facility] Name of Therapist: [facility]  Education Status Is patient currently in school?: No Is the patient employed, unemployed or receiving disability?: Unemployed  Risk to self with the past 6 months Suicidal Ideation: No Has patient been a risk to self within the past 6 months prior to admission? : No Suicidal Intent: No Has patient had any suicidal intent within the past 6 months prior to admission? : No Is patient at risk for suicide?: No Suicidal Plan?: No Has patient had any suicidal plan within the past 6 months prior to admission? : No Access to Means: No What has been your use of drugs/alcohol within the last 12 months?: none Previous Attempts/Gestures: No How many times?: 0 Other Self Harm Risks: none Triggers for Past Attempts: None known Intentional Self Injurious Behavior: None Family Suicide History: No Recent stressful life event(s): Other (Comment) (loss of custody of newborn) Persecutory voices/beliefs?: No Depression: Yes Depression Symptoms: Insomnia, Isolating, Feeling angry/irritable Substance abuse history and/or treatment for  substance abuse?: No Suicide prevention information given to non-admitted patients: Not applicable  Risk to Others within the past 6 months Homicidal Ideation: No Does patient have any lifetime risk of violence toward others beyond the six months prior to admission? : No Thoughts of Harm to Others: No Current Homicidal Intent: No Current Homicidal Plan: No Access to Homicidal Means: No Identified Victim: none History of harm to others?: No Assessment of Violence: None Noted Violent Behavior Description: none Does patient have access to weapons?: No Criminal Charges Pending?: No Does patient have a court date: No Is patient on probation?: No  Psychosis Hallucinations: None noted Delusions: Persecutory  Mental Status Report Appearance/Hygiene: Unremarkable Eye Contact: Good Motor Activity: Freedom of movement Speech: Logical/coherent Level of Consciousness: Alert Mood: Depressed Affect: Flat Anxiety Level: Moderate Thought Processes: Coherent, Relevant Judgement: Impaired Orientation: Person, Place, Time, Situation Obsessive Compulsive Thoughts/Behaviors: None  Cognitive Functioning Concentration: Normal Memory: Recent Intact, Remote Intact Is patient IDD: No Insight: Fair Impulse Control: Poor Appetite: Good Have you had any weight changes? : Loss Amount of the weight change? (lbs): 15 lbs (patient just delivered a baby) Sleep: Decreased Total  Hours of Sleep:  (patient states 7 hrs nightly, sister disagrees)  ADLScreening [facility] Assessment Services) Patient's cognitive ability adequate to safely complete daily activities?: Yes Patient able to express need for assistance with ADLs?: Yes Independently performs ADLs?: Yes (appropriate for developmental age)  Prior Inpatient Therapy Prior Inpatient Therapy: Yes Prior Therapy Dates:  (patient has been admitted 1-2 times in the past six months) Prior Therapy Facilty/Provider(s): [facility] Reason for Treatment:  psychosis  Prior Outpatient Therapy Prior Outpatient Therapy: Yes Prior Therapy Dates: active Prior Therapy Facilty/Provider(s): [facility] Reason for Treatment: medication management Does patient have an ACCT team?: No (Has a community support team) Does patient have Intensive In-House Services?  : No Does patient have [facility] services? : No Does patient have P4CC services?: No  ADL Screening (condition at time of admission) Patient's cognitive ability adequate to safely complete daily activities?: Yes Is the patient deaf or have difficulty hearing?: No Does the patient have difficulty seeing, even when wearing glasses/contacts?: No Does the patient have difficulty concentrating, remembering, or making decisions?: No Patient able to express need for assistance with ADLs?: Yes Does the patient have difficulty dressing or bathing?: No Independently performs ADLs?: Yes (appropriate for developmental age) Does the patient have difficulty walking or climbing stairs?: No Weakness of Legs: None Weakness of Arms/Hands: None  Home Assistive Devices/Equipment Home Assistive Devices/Equipment: None  Therapy Consults (therapy consults require a physician order) PT Evaluation Needed: No OT Evalulation Needed: No SLP Evaluation Needed: No Abuse/Neglect Assessment (Assessment to be complete while patient is alone) Abuse/Neglect Assessment Can Be Completed: Yes Physical Abuse: Denies Verbal Abuse: Denies Sexual Abuse: Denies Exploitation of patient/patient's resources: Denies Self-Neglect: Denies Values / Beliefs Cultural Requests During Hospitalization: None Spiritual Requests During Hospitalization: None Consults Spiritual Care Consult Needed: No Transition of Care Team Consult Needed: No Advance Directives (For Healthcare) Does Patient Have a Medical Advance Directive?: No Would patient like information on creating a medical advance directive?: No - Patient declined    Disposition: Per  Dr Lucianne Muss, patient is on IVC.  She will receive her Invega Injection tonight and will be monitored for safety and stability overnight and re-evaluated tomorrow. Disposition Initial Assessment Completed for this Encounter: Yes  This service was provided via telemedicine using a 2-way, interactive audio and video technology.  Names of all persons participating in this telemedicine service and their role in this encounter. Name: Brandy Sweeney Role: patient  Name: [name] Role: patient's sister  Name: Danny Sprinkle Role: TTS  Name:  Role:     Daphene Calamity 09/12/2019 4:45 PM

## 2019-09-12 NOTE — ED Notes (Signed)
Patient denies pain and is resting comfortably.  

## 2019-09-12 NOTE — Progress Notes (Signed)
I was asked to confirm Brandy Sweeney dosing. Confirmed that patient was given Invega Sustenna 234 mg on 08/09/19 at [facility]. I consulted with Dr. Lucianne Muss and the recommend that the patient continue the Invega Sustenna 234 mg IM Q28 Days, with first dose 09/12/19. I have placed an order for this medication.

## 2019-09-12 NOTE — ED Notes (Signed)
ALL belongings inventoried - 1 labeled belongings bag - placed in Locker #1 - No valuables noted.

## 2019-09-12 NOTE — ED Notes (Signed)
Pt woke briefly for injection - tolerated well - then returned to sleeping.

## 2019-09-13 MED ORDER — NICOTINE 21 MG/24HR TD PT24
21.0000 mg | MEDICATED_PATCH | Freq: Every day | TRANSDERMAL | Status: DC
  Administered 2019-09-13: 21 mg via TRANSDERMAL
  Filled 2019-09-13: qty 1

## 2019-09-13 NOTE — ED Notes (Addendum)
Pt requesting reassessment and status update, this RN attempted to contact counselor with no response. Pt calm and cooperative.

## 2019-09-14 ENCOUNTER — Encounter (HOSPITAL_COMMUNITY): Payer: Self-pay | Admitting: Registered Nurse

## 2019-09-14 DIAGNOSIS — F259 Schizoaffective disorder, unspecified: Secondary | ICD-10-CM | POA: Diagnosis not present

## 2019-09-14 NOTE — Discharge Instructions (Signed)
Keep Scheduled Appointment with [facility] for outpatient psychiatric services.  Next Gean Birchwood is due 10/13/19

## 2019-09-14 NOTE — Consult Note (Signed)
[facility] ED Progress Note  09/14/2019 9:57 AM Yeilyn Gent  MRN:  [id number]   Subjective:  "I'm feeling much better and I want to go home" Braulio Conte, 29 y.o., female patient seen via tele psych by this provider, consulted with Dr. Viviano Simas; and chart reviewed on 09/14/19.  On evaluation Shaela Boer reports she was brought to the hospital after she was IVC'ed by her sister for missing her Gean Birchwood injection on Wednesday of last week.  Patient states she was given the injection the on the first day of her hospital stay ans that she is feeling much better now.  "I was suppose to go to [place] to get the injection but they moved and I can't take the bus there no more and I don't have transportation and I didn't have no body to take me.  I'm suppose to start with [facility] and a team will be coming to my house but it hadn't started yet.  But my sister got upset cause I hadn't got the shot."  Patient states that she lives a lone; she has 2 children (2 sons) and each of her sisters has one child living with them.  Patient states although she missed her Invega injection "I had some Invega pills that I was taking until I could get to [facility] and get my injection."   During evaluation Romanda Turrubiates is alert/oriented x 5; calm/cooperative; and mood is congruent with affect.  She does not appear to be responding to internal/external stimuli or delusional thoughts.  Patient denies suicidal/self-harm/homicidal ideation, psychosis, and paranoia.  Patient answered question appropriately.     Principal Problem: Schizoaffective disorder, unspecified type (HCC) Diagnosis:  Principal Problem:   Schizoaffective disorder, unspecified type (HCC)  Total Time spent with patient: 30 minutes  Past Psychiatric History: Schizoaffective disorder  Past Medical History:  Past Medical History:  Diagnosis Date  . Postpartum depression   . Pregnant   . PTSD (post-traumatic stress disorder)   . Schizo affective schizophrenia  [facility])     Past Surgical History:  Procedure Laterality Date  . CESAREAN SECTION    . CESAREAN SECTION N/A 08/01/2019   Procedure: CESAREAN SECTION;  Surgeon: Lazaro Arms, MD;  Location: [facility]S;  Service: Obstetrics;  Laterality: N/A;  . HIP SURGERY     Family History: History reviewed. No pertinent family history. Family Psychiatric  History: Denies Social History:  Social History   Substance and Sexual Activity  Alcohol Use No     Social History   Substance and Sexual Activity  Drug Use Not Currently    Social History   Socioeconomic History  . Marital status: Single    Spouse name: Not on file  . Number of children: Not on file  . Years of education: Not on file  . Highest education level: Not on file  Occupational History  . Not on file  Tobacco Use  . Smoking status: Current Some Day Smoker    Packs/day: 0.50    Years: 6.00    Pack years: 3.00    Types: Cigarettes  . Smokeless tobacco: Never Used  Vaping Use  . Vaping Use: Never used  Substance and Sexual Activity  . Alcohol use: No  . Drug use: Not Currently  . Sexual activity: Not Currently    Birth control/protection: None  Other Topics Concern  . Not on file  Social History Narrative  . Not on file   Social Determinants of Health   Financial Resource Strain:   .  Difficulty of Paying Living Expenses: Not on file  Food Insecurity:   . Worried About Programme researcher, broadcasting/film/video in the Last Year: Not on file  . Ran Out of Food in the Last Year: Not on file  Transportation Needs:   . Lack of Transportation (Medical): Not on file  . Lack of Transportation (Non-Medical): Not on file  Physical Activity:   . Days of Exercise per Week: Not on file  . Minutes of Exercise per Session: Not on file  Stress:   . Feeling of Stress : Not on file  Social Connections:   . Frequency of Communication with Friends and Family: Not on file  . Frequency of Social Gatherings with Friends and Family: Not on file  .  Attends Religious Services: Not on file  . Active Member of Clubs or Organizations: Not on file  . Attends Banker Meetings: Not on file  . Marital Status: Not on file    Sleep: Good  Appetite:  Good  Current Medications: Current Facility-Administered Medications  Medication Dose Route Frequency Provider Last Rate Last Admin  . nicotine (NICODERM CQ - dosed in mg/24 hours) patch 21 mg  21 mg Transdermal Daily Jacalyn Lefevre, MD   21 mg at 09/13/19 1028  . paliperidone (INVEGA SUSTENNA) injection 234 mg  234 mg Intramuscular Q28 days Money, Gerlene Burdock, [place]   234 mg at 09/12/19 1644   Current Outpatient Medications  Medication Sig Dispense Refill  . ARIPiprazole (ABILIFY MAINTENA IM) Inject into the muscle.    . naproxen sodium (ALEVE) 220 MG tablet Take 220-440 mg by mouth 2 (two) times daily as needed.    . Nicotine (NICODERM CQ TD) Place 1 patch onto the skin daily as needed (FOR SMOKING CESSATION).    Marland Kitchen divalproex (DEPAKOTE) 250 MG DR tablet Take 3 tablets (750 mg total) by mouth 2 (two) times daily. For mood stabilization 180 tablet 0  . FLUoxetine (PROZAC) 10 MG capsule Take 1 capsule (10 mg total) by mouth daily. For depression 30 capsule 0  . hydrOXYzine (ATARAX/VISTARIL) 25 MG tablet Take 1 tablet (25 mg total) by mouth 3 (three) times daily as needed for anxiety. 75 tablet 0  . nicotine (NICODERM CQ - DOSED IN MG/24 HOURS) 14 mg/24hr patch Place 1 patch (14 mg total) onto the skin daily. (May buy from over the counter): For smoking cessation 1 patch 0  . NIFEdipine (ADALAT CC) 60 MG 24 hr tablet Take 1 tablet (60 mg total) by mouth daily. For high blood pressure 15 tablet 0  . omega-3 acid ethyl esters (LOVAZA) 1 g capsule Take 1 capsule (1 g total) by mouth 2 (two) times daily. For hypertriglyceride 60 capsule 0  . paliperidone (INVEGA) 3 MG 24 hr tablet Take 3 mg by mouth daily.     . paliperidone (INVEGA) 6 MG 24 hr tablet Take 1 tablet (6 mg total) by mouth at  bedtime. For mood control 30 tablet 0  . Paliperidone Palmitate (INVEGA SUSTENNA IM) Inject 1 Syringe into the muscle every 30 (thirty) days. (Patient not taking: Reported on 09/12/2019)    . zolpidem (AMBIEN) 5 MG tablet Take 1 tablet (5 mg total) by mouth at bedtime as needed for sleep. 30 tablet 0    Lab Results:  Results for orders placed or performed during the hospital encounter of 09/11/19 (from the past 48 hour(s))  SARS Coronavirus 2 by RT PCR (hospital order, performed in [facility] hospital lab) Nasopharyngeal Nasopharyngeal Swab  Status: None   Collection Time: 09/12/19  2:15 PM   Specimen: Nasopharyngeal Swab  Result Value Ref Range   SARS Coronavirus 2 NEGATIVE NEGATIVE    Comment: (NOTE) SARS-CoV-2 target nucleic acids are NOT DETECTED.  The SARS-CoV-2 RNA is generally detectable in upper and lower respiratory specimens during the acute phase of infection. The lowest concentration of SARS-CoV-2 viral copies this assay can detect is 250 copies / mL. A negative result does not preclude SARS-CoV-2 infection and should not be used as the sole basis for treatment or other patient management decisions.  A negative result may occur with improper specimen collection / handling, submission of specimen other than nasopharyngeal swab, presence of viral mutation(s) within the areas targeted by this assay, and inadequate number of viral copies (<250 copies / mL). A negative result must be combined with clinical observations, patient history, and epidemiological information.  Fact Sheet for Patients:   BoilerBrush.com.cy  Fact Sheet for Healthcare Providers: https://pope.com/  This test is not yet approved or  cleared by the [place] FDA and has been authorized for detection and/or diagnosis of SARS-CoV-2 by FDA under an Emergency Use Authorization (EUA).  This EUA will remain in effect (meaning this test can be used) for the  duration of the COVID-19 declaration under Section 564(b)(1) of the Act, 21 U.S.C. section 360bbb-3(b)(1), unless the authorization is terminated or revoked sooner.  Performed at [facility], [address] [place], [place] [address]     Blood Alcohol level:  Lab Results  Component Value Date   ETH <10 09/11/2019   ETH <10 08/06/2019    Physical Findings: AIMS:  , ,  ,  ,    CIWA:    COWS:     Musculoskeletal: Strength & Muscle Tone: within normal limits Gait & Station: normal Patient leans: N/A  Psychiatric Specialty Exam: Physical Exam Vitals and nursing note reviewed. Exam conducted with a chaperone present.  Constitutional:      Appearance: Normal appearance.  Pulmonary:     Effort: Pulmonary effort is normal.  Musculoskeletal:        General: Normal range of motion.  Neurological:     Mental Status: She is alert.  Psychiatric:        Attention and Perception: Attention and perception normal.        Mood and Affect: Mood and affect normal.        Speech: Speech normal.        Behavior: Behavior normal. Behavior is cooperative.        Thought Content: Thought content normal. Thought content is not paranoid or delusional. Thought content does not include homicidal or suicidal ideation.        Cognition and Memory: Cognition and memory normal.        Judgment: Judgment normal.     Review of Systems  Psychiatric/Behavioral: Negative.  Negative for agitation, confusion, hallucinations, self-injury, sleep disturbance and suicidal ideas. The patient is not nervous/anxious.        Patient reporting she missed getting her Hinda Glatter "shot on Wednesday and my sister got upset and had me IVC'ed"  All other systems reviewed and are negative.   Blood pressure 133/89, pulse 76, temperature 98.8 F (37.1 C), temperature source Oral, resp. rate 17, SpO2 98 %, unknown if currently breastfeeding.There is no height or weight on file to calculate BMI.  General Appearance:  Casual  Eye Contact:  Good  Speech:  Clear and Coherent and Normal Rate  Volume:  Normal  Mood:  "Good"  Affect:  Appropriate and Congruent  Thought Process:  Coherent, Goal Directed and Descriptions of Associations: Intact  Orientation:  Full (Time, Place, and Person)  Thought Content:  WDL  Suicidal Thoughts:  No  Homicidal Thoughts:  No  Memory:  Immediate;   Good Recent;   Good  Judgement:  Intact  Insight:  Present  Psychomotor Activity:  Normal  Concentration:  Concentration: Good and Attention Span: Good  Recall:  Good  Fund of Knowledge:  Fair  Language:  Good  Akathisia:  No  Handed:  Right  AIMS (if indicated):     Assets:  Communication Skills Desire for Improvement Housing Physical Health Social Support  ADL's:  Intact  Cognition:  WNL  Sleep:      Patient was given Invega Sustenna 234 mg IM on 09/12/19; there has been no adverse reaction.  There has been no psychical or verbal outburst while in the ED.  Patient eating/sleeping without difficulty.  Patient denies suicidal/self-harm/homicidal ideation, psychosis, and paranoia.  Patient also has an appointment with [facility] to start intensive in home services.  Patient has been psychiatrically cleared to follow up with [facility] (keep scheduled appointment)     Treatment Plan Summary: Plan Psychiatrically clear to follow up with [facility]   Disposition:  Psychiatrically cleared No evidence of imminent risk to self or others at present.   Patient does not meet criteria for psychiatric inpatient admission. Supportive therapy provided about ongoing stressors. Discussed crisis plan, support from social network, calling 911, coming to the Emergency Department, and calling Suicide Hotline.  Makalya Nave, NP 09/14/2019, 9:57 AM

## 2019-09-14 NOTE — ED Notes (Signed)
Patient verbalizes understanding of discharge instructions . Opportunity for questions and answers were provided . Armband removed by staff ,Pt discharged from ED. W/C  offered at D/C  and Declined W/C at D/C and was escorted to lobby by RN.  

## 2019-09-18 ENCOUNTER — Ambulatory Visit: Payer: Self-pay | Admitting: Certified Nurse Midwife

## 2020-09-21 IMAGING — US US MFM OB LIMITED
1 series · 15 of 28 positions shown · non-contrast
Comparison: none

[Series 1: us mfm ob limited · 15 of 56 slices shown]
[im 1/56]
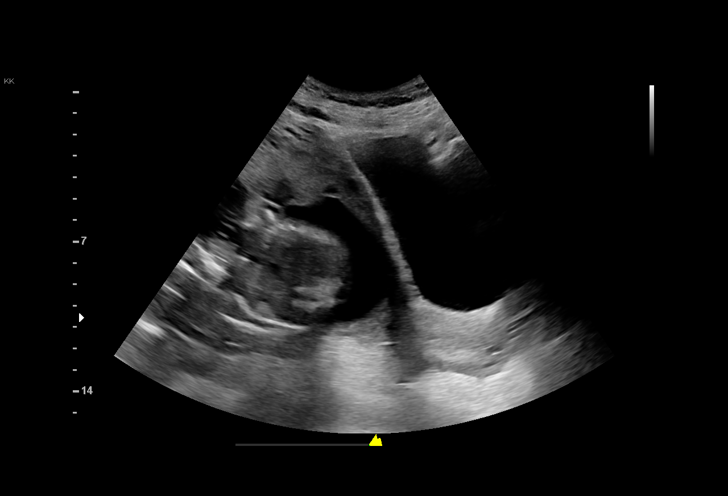
[im 5/56]
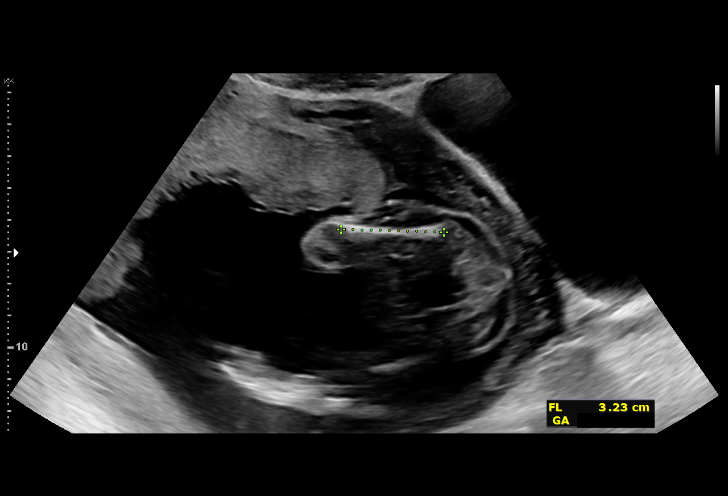
[im 9/56]
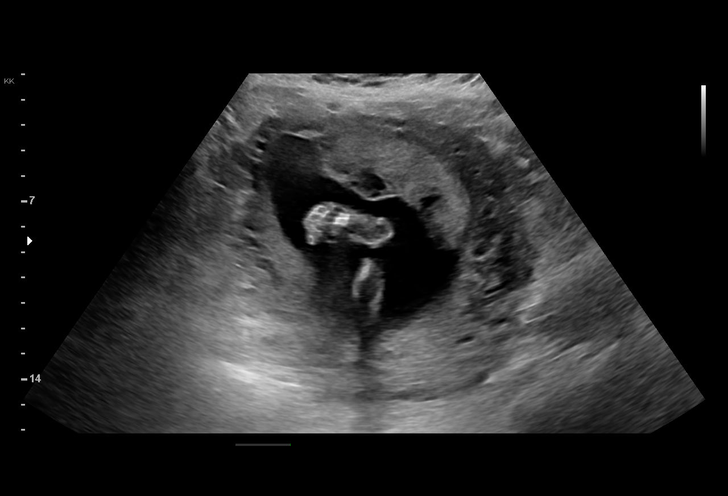
[im 13/56]
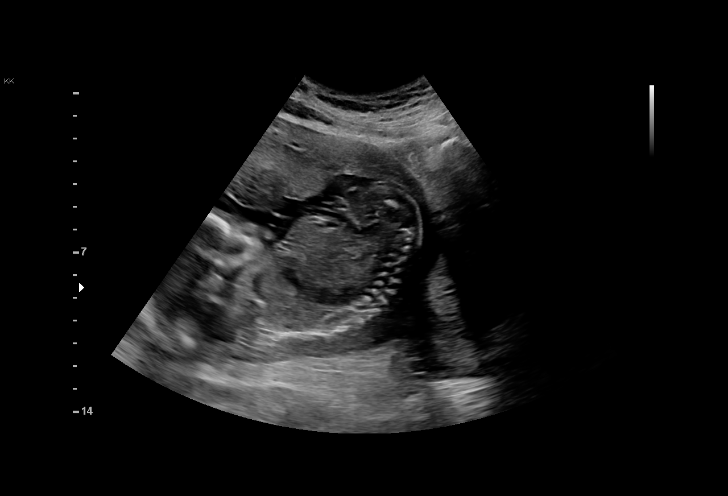
[im 17/56]
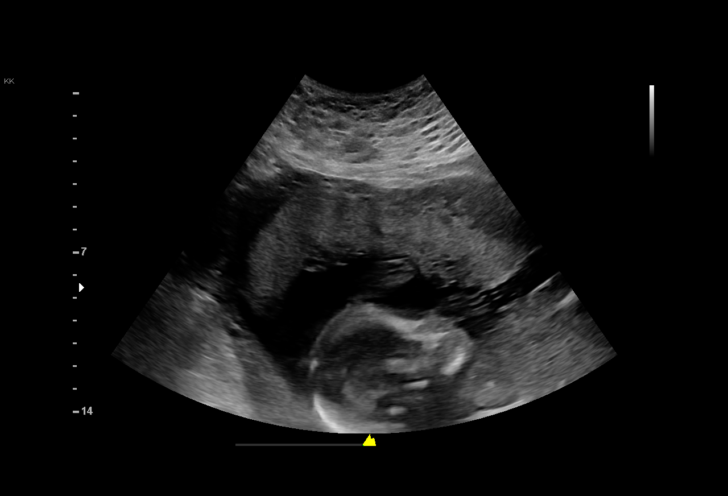
[im 21/56]
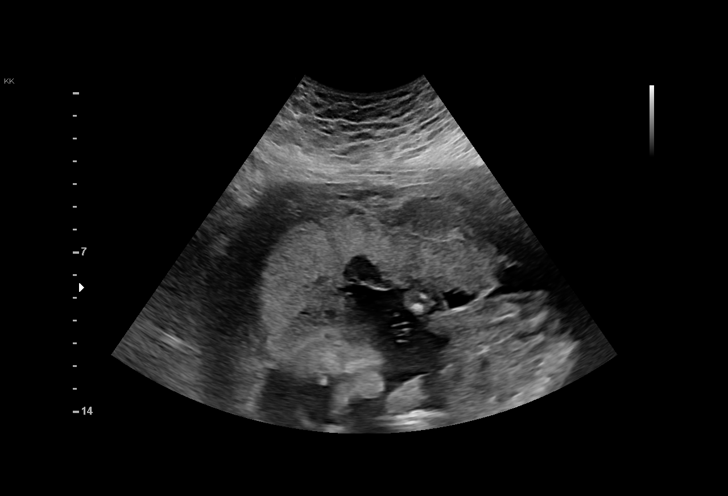
[im 25/56]
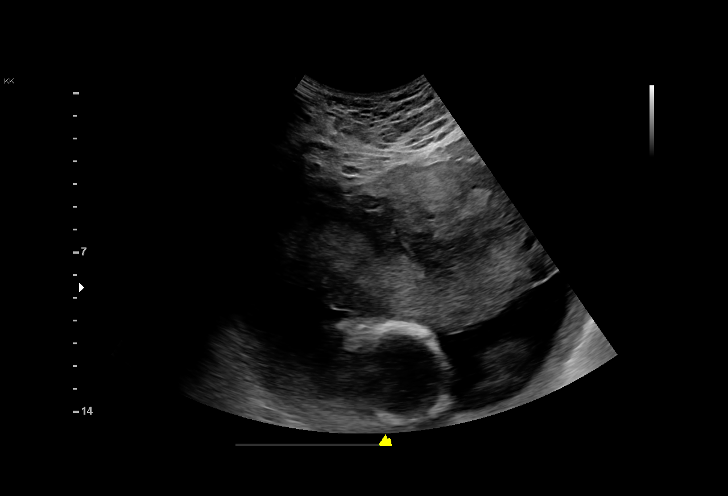
[im 29/56]
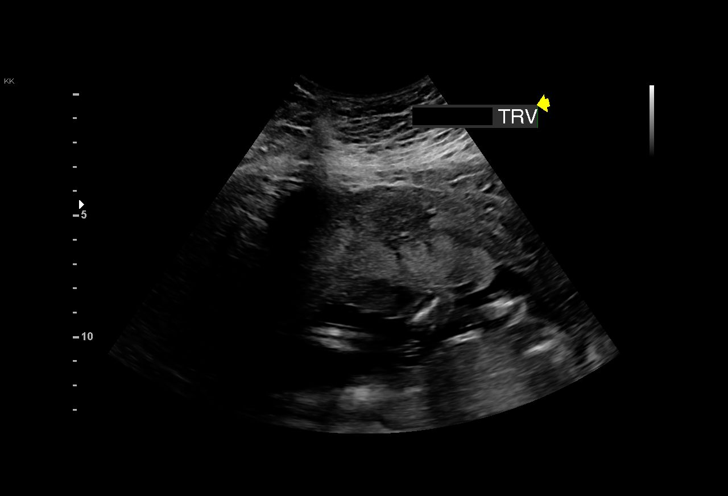
[im 31/56]
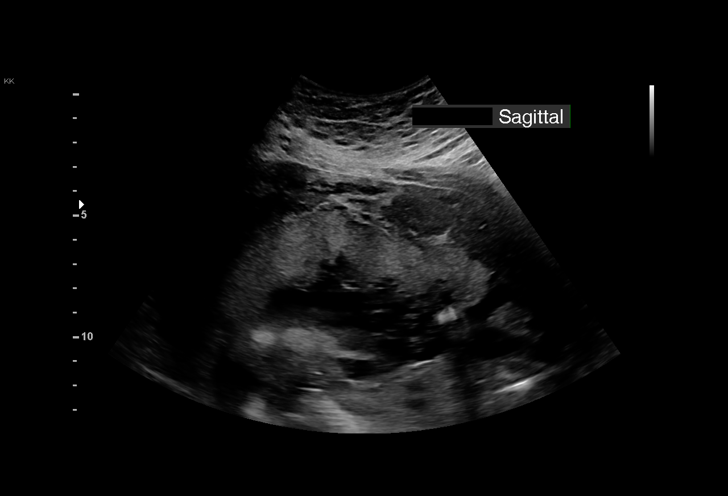
[im 35/56]
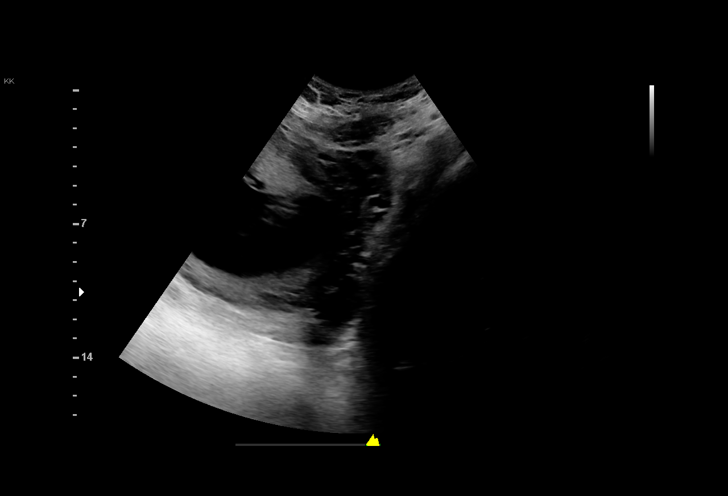
[im 39/56]
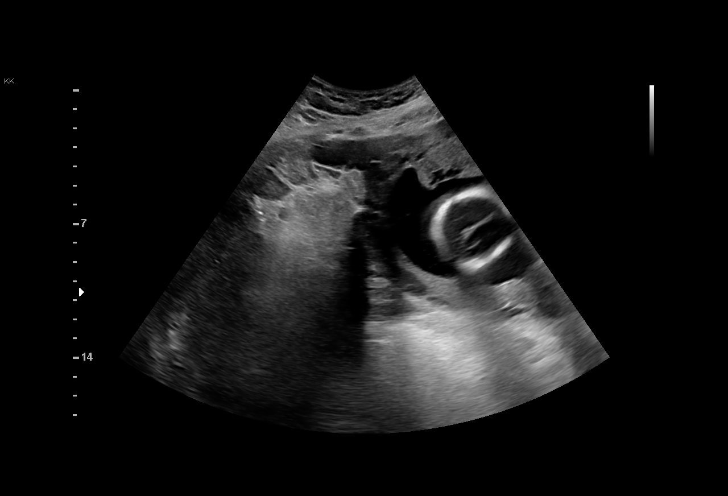
[im 43/56]
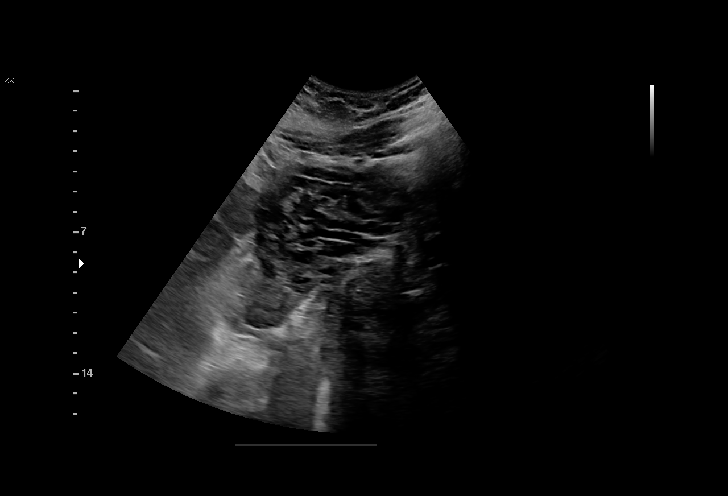
[im 47/56]
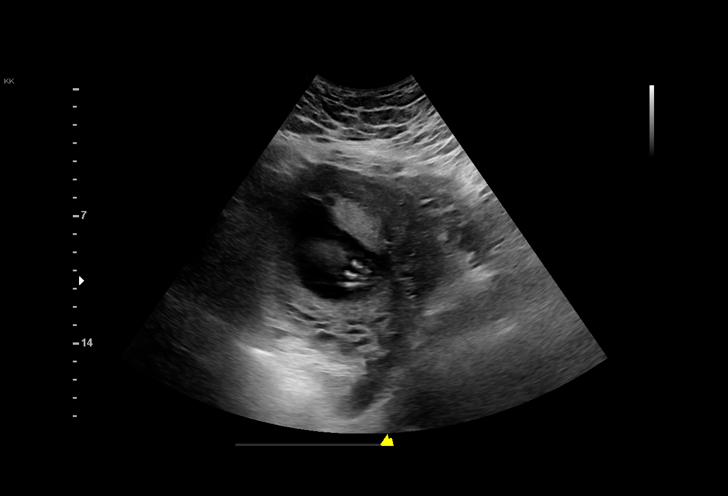
[im 51/56]
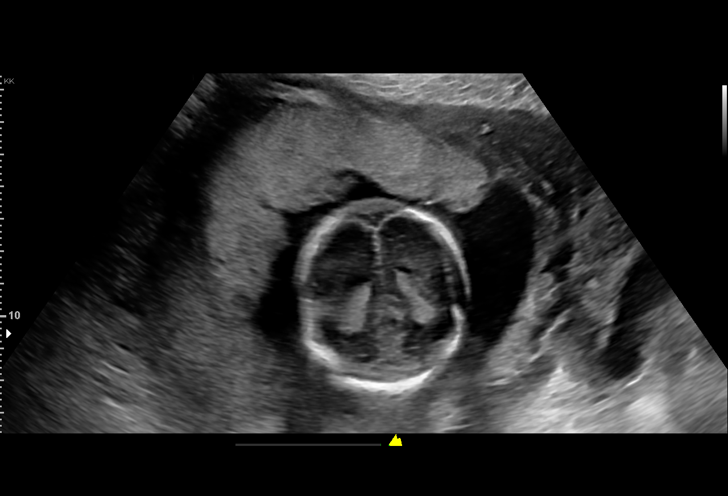
[im 56/56]
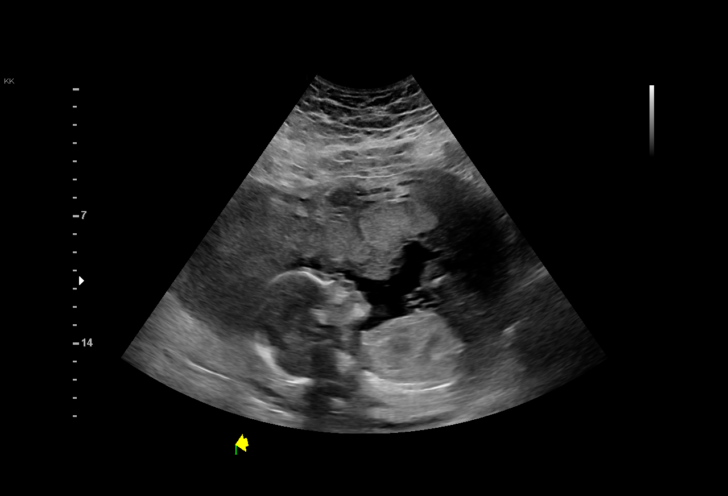

[15 of 28 positions shown; findings below may reference images not displayed]

1  US MFM OB LIMITED                    76815.01     MAXEMILIENNE LANDOH
 ----------------------------------------------------------------------

 ----------------------------------------------------------------------
Indications

  Pelvic pain affecting pregnancy in second
  trimester
  Insufficient Prenatal Care
  Obesity complicating pregnancy, second
  trimester
  History of cesarean delivery, currently
  pregnant
  Weeks of gestation of pregnancy not
  specified
 ----------------------------------------------------------------------
Vital Signs

 BMI:
Fetal Evaluation

 Num Of Fetuses:          1
 Fetal Heart Rate(bpm):   145
 Cardiac Activity:        Observed
 Presentation:            Variable
 Placenta:                Anterior
 P. Cord Insertion:       Visualized

 Amniotic Fluid
 AFI FV:      Within normal limits

                             Largest Pocket(cm)

Biometry

 BPD:      49.6  mm     G. Age:  21w 0d
                                                         FL/BPD:      65.9  %
 FL:       32.7  mm     G. Age:  20w 1d
OB History

 Gravidity:    2         Term:   1        Prem:   0        SAB:   0
 TOP:          0       Ectopic:  0        Living: 1
Gestational Age

 U/S Today:     20w 4d                                        EDD:   07/30/19
Anatomy

 Choroid Plexus:        Visualized             Bladder:                Visualized
 Stomach:               Visualized
Cervix Uterus Adnexa

 Cervix
 Length:            4.1  cm.
 Normal appearance by transabdominal scan.

 Left Ovary
 Within normal limits.

 Right Ovary
 Within normal limits.

 Adnexa
 No abnormality visualized.
Impression

 Limited exam with no prenatal care this pregnancy
 Maternal complaints of low pelvic pain
 There is no evidence of placental abruption or previa
Recommendations

 Follow up dating ultrasound scehduled at first available.

## 2022-06-29 DIAGNOSIS — Z79899 Other long term (current) drug therapy: Secondary | ICD-10-CM | POA: Diagnosis not present

## 2022-06-30 DIAGNOSIS — F250 Schizoaffective disorder, bipolar type: Secondary | ICD-10-CM | POA: Diagnosis not present

## 2022-07-29 DIAGNOSIS — F250 Schizoaffective disorder, bipolar type: Secondary | ICD-10-CM | POA: Diagnosis not present

## 2022-08-16 DIAGNOSIS — F250 Schizoaffective disorder, bipolar type: Secondary | ICD-10-CM | POA: Diagnosis not present

## 2022-09-02 DIAGNOSIS — F250 Schizoaffective disorder, bipolar type: Secondary | ICD-10-CM | POA: Diagnosis not present

## 2022-09-30 DIAGNOSIS — F250 Schizoaffective disorder, bipolar type: Secondary | ICD-10-CM | POA: Diagnosis not present

## 2022-10-31 DIAGNOSIS — F250 Schizoaffective disorder, bipolar type: Secondary | ICD-10-CM | POA: Diagnosis not present

## 2022-11-28 DIAGNOSIS — F250 Schizoaffective disorder, bipolar type: Secondary | ICD-10-CM | POA: Diagnosis not present

## 2023-01-06 DIAGNOSIS — F250 Schizoaffective disorder, bipolar type: Secondary | ICD-10-CM | POA: Diagnosis not present

## 2023-02-09 DIAGNOSIS — F250 Schizoaffective disorder, bipolar type: Secondary | ICD-10-CM | POA: Diagnosis not present
